# Patient Record
Sex: Female | Born: 1945
Health system: Southern US, Community
[De-identification: ages and names within clinical notes are randomized; demographics above are authoritative.]

## PROBLEM LIST (undated history)

## (undated) DIAGNOSIS — I10 Essential (primary) hypertension: Secondary | ICD-10-CM

## (undated) DIAGNOSIS — N809 Endometriosis, unspecified: Secondary | ICD-10-CM

## (undated) DIAGNOSIS — E785 Hyperlipidemia, unspecified: Secondary | ICD-10-CM

## (undated) HISTORY — DX: Hyperlipidemia, unspecified: E78.5

## (undated) HISTORY — PX: OTHER SURGICAL HISTORY: SHX169

---

## 1991-03-06 HISTORY — PX: HYSTERECTOMY ABDOMINAL WITH SALPINGECTOMY: SHX6725

## 1997-05-21 ENCOUNTER — Other Ambulatory Visit: Admission: RE | Admit: 1997-05-21 | Discharge: 1997-05-21 | Payer: Self-pay | Admitting: Obstetrics and Gynecology

## 1997-05-28 ENCOUNTER — Ambulatory Visit (HOSPITAL_COMMUNITY): Admission: RE | Admit: 1997-05-28 | Discharge: 1997-05-28 | Payer: Self-pay | Admitting: Obstetrics and Gynecology

## 2008-05-04 ENCOUNTER — Encounter: Admission: RE | Admit: 2008-05-04 | Discharge: 2008-05-04 | Payer: Self-pay | Admitting: Internal Medicine

## 2008-07-16 ENCOUNTER — Emergency Department (HOSPITAL_COMMUNITY): Admission: EM | Admit: 2008-07-16 | Discharge: 2008-07-16 | Payer: Self-pay | Admitting: Emergency Medicine

## 2008-07-20 ENCOUNTER — Inpatient Hospital Stay (HOSPITAL_COMMUNITY): Admission: RE | Admit: 2008-07-20 | Discharge: 2008-07-22 | Payer: Self-pay | Admitting: Orthopedic Surgery

## 2010-06-13 LAB — BASIC METABOLIC PANEL
BUN: 15 mg/dL (ref 6–23)
CO2: 29 mEq/L (ref 19–32)
Calcium: 9.2 mg/dL (ref 8.4–10.5)
Chloride: 106 mEq/L (ref 96–112)
Creatinine, Ser: 0.83 mg/dL (ref 0.4–1.2)
GFR calc Af Amer: >60 mL/min (ref >60)
GFR calc non Af Amer: >60 mL/min (ref >60)
Glucose, Bld: 105 mg/dL — ABNORMAL HIGH (ref 70–99)
Potassium: 3.9 mEq/L (ref 3.5–5.1)
Sodium: 141 mEq/L (ref 135–145)

## 2010-06-13 LAB — HEMOGLOBIN AND HEMATOCRIT, BLOOD
HCT: 32.8 % — ABNORMAL LOW (ref 36.0–46.0)
Hemoglobin: 10.9 g/dL — ABNORMAL LOW (ref 12.0–15.0)

## 2010-07-18 NOTE — Op Note (Signed)
NAMECOY, VANDOREN NO.:  [id number]   MEDICAL RECORD NO.:  [id number]          PATIENT TYPE:  INP   LOCATION:  0098                         FACILITY:  [facility]   PHYSICIAN:  Dionne Ano. Gramig, M.D.DATE OF BIRTH:  21-Jan-1946   DATE OF PROCEDURE:  DATE OF DISCHARGE:                               OPERATIVE REPORT   PREOPERATIVE DIAGNOSES:  Unstable irreducible left elbow dislocation  with multiligamentous stability.   POSTOPERATIVE DIAGNOSES:  Unstable irreducible left elbow dislocation  with multiligamentous stability.   PROCEDURES:  1. Open reduction left elbow dislocation.  2. Medial ulnar collateral ligament repair reconstruction with push-      lock technique.  3. Flexor pronator repair of the musculotendinous tissue left elbow      with suture anchor technique utilizing a Biomet looped suture      anchor and imbricated 2-0 FiberWire.  4. Cubital tunnel release left elbow.  5. Stress radiography.   SURGEON:  Dionne Ano. Amanda Pea, M.D.   ASSISTANT:  Karie Chimera, P.A.-C.   COMPLICATIONS:  None.   ANESTHESIA:  General.   TOURNIQUET TIME:  Less than 90 minutes.   INDICATIONS FOR PROCEDURE:  This patient is a pleasant female who  presented to the emergency room at [facility] on Friday night.  She was stabilized, referred and general orthopedics then saw her and  referred her to hand surgery for our care and expertise.  General  surgery felt uncomfortable with the multiligamentous injury and the  irreducible nature of her dislocation.  She has now had a dislocation  for over 5 days and has not gone onto quiescent state of affairs.  She  is currently in a dislocated state.  I counseled her in regards to this  and discussed with her the need for surgical reduction and repair ASAP.  She understands the risks and benefits of bleeding, infection,  anesthesia, damage to normal structures and failure of the surgery to  accomplish its intended goals of  relieving symptoms and restoring  function.  With this mind, she will proceed accordingly.  All questions  have been encouraged and answered preoperatively.   OPERATIVE PROCEDURE:  The patient was seen by myself and Anesthesia.  She was taken to the operative suite and underwent general anesthetic.  Prior to this, her arm was marked.  Consent verified and all questions  were encouraged and answered as well as a timeout being called.  Under  anesthesia, the arm was prepped.  I brought in live fluoro.  I saw a  fleck off of the ulnar collateral ligament medially indicating a highly  abnormal zone of injury.  She was stress tested.  I could get the elbow  back in, but she was certainly unstable.  At this time, I removed the x-  ray, secured the sterile field with Betadine scrub and paint and made a  posteromedial incision.  Dissection was carried down.  It was quite  apparent that she had completely blown out her flexor pronator mass and  her ulnar collateral ligament.  A fingertip could be placed directly in  the joint after  skin incision.  She had marked disarray of the tendon,  ligament and muscles.  We performed evaluation and removal of hematoma  and irrigation of the joint.  Her most impressively unstable region was  medially of course.  At this time, I identified the ulnar nerve.  I  released the ulnar nerve about the medial intermuscular septum, cubital  tunnel, two edges of the FCU and Osborne's ligament.  The arcade of  Struthers was also released without complicating feature.  I did not  transposed the nerve.  I kept the nerves out of harms way at all times.  The medial antebrachial cutaneous nerve branch was also identified and  protected.  Following this, I then placed a Krackow #2 FiberWire stitch  about the ulnar collateral ligament and then found the point of isometry  where it was avulsed for reattachment.  I then preplaced two suture  anchors from Biomet for flexor pronator  repair.  Following this, I then  irrigated copiously.  I reduced the elbow joint nicely, advanced the  push-lock with standard push-lock technology and this recreated the  anatomic distribution of the ulnar collateral ligament.  I then repaired  the flexor pronator mass.  The flexor pronator mass was repaired  separately and was a distinct and separate portion of the procedure as  was the cubital tunnel release given the nerve entrapment and kinking  secondary to the abnormal disarray of flexor pronator mass.  Following  this, I performed live stress radiography.  She was stable with full  range of motion with no evidence of instability as noted prior.  At this  time, I felt that external fixation and lateral ulnar collateral  ligament repair would not be necessary and thus irrigated and closed the  wound with Vicryl followed by a subcuticular suture.  Hemostasis was  obtained with bipolar cautery during the initial dissection and thus I  did not have to place a drain as she was quite stable and had a good  hemostatic field.  The patient had soft compartments, excellent radial  pulse and adequate refill at the conclusion of the operative  intervention.  The patient was placed in a sterile bandage with a  posterior plaster splint and stirrups with a Leavy Cella bridge placed as well.  The patient had x-rays in the splint taken as well which showed  excellent reduction.  I was pleased with this and the findings.  Final  stress radiography copies were made for documentation.  She was taken to  the recovery room.  She will be placed on Ancef 1 gram IV q.8 h., to  begin in the recovery room.  Pain management according to her needs of  course and our standard postop algorithm.   We will see her back in the office.  I want to keep her at 90 degrees  for 3 weeks.  I will go ahead and allow her flexion to full and  extension to 80-90 degrees after her first postop visit.  At 3 weeks  postop, I will  allow her to go down to 70 degrees and then we are going  to move her down by 10-15 degrees weekly thereafter.  We want to be very  careful and avoid any valgus stress to the arm in therapy, thus we are  going to keep her in supination.  I have discussed these issues with the  family.  All questions have been encouraged and answered.      Dionne Ano. Gramig,  M.D.  Electronically Signed     WMG/MEDQ  D:  07/20/2008  T:  07/20/2008  Job:  [id number]

## 2012-03-25 ENCOUNTER — Other Ambulatory Visit: Payer: Self-pay | Admitting: Internal Medicine

## 2012-03-25 DIAGNOSIS — Z1231 Encounter for screening mammogram for malignant neoplasm of breast: Secondary | ICD-10-CM

## 2012-04-24 ENCOUNTER — Ambulatory Visit
Admission: RE | Admit: 2012-04-24 | Discharge: 2012-04-24 | Disposition: A | Discharge status: Home / Self Care | Payer: Medicare HMO | Source: Ambulatory Visit | Attending: Internal Medicine | Admitting: Internal Medicine

## 2012-04-24 DIAGNOSIS — Z1231 Encounter for screening mammogram for malignant neoplasm of breast: Secondary | ICD-10-CM

## 2013-07-10 ENCOUNTER — Emergency Department (HOSPITAL_COMMUNITY)
Admission: EM | Admit: 2013-07-10 | Discharge: 2013-07-10 | Disposition: A | Discharge status: Home / Self Care | Payer: Medicare HMO | Attending: Emergency Medicine | Admitting: Emergency Medicine

## 2013-07-10 ENCOUNTER — Encounter (HOSPITAL_COMMUNITY): Payer: Self-pay | Admitting: Emergency Medicine

## 2013-07-10 ENCOUNTER — Emergency Department (HOSPITAL_COMMUNITY): Payer: Medicare HMO

## 2013-07-10 DIAGNOSIS — Y9389 Activity, other specified: Secondary | ICD-10-CM | POA: Insufficient documentation

## 2013-07-10 DIAGNOSIS — IMO0002 Reserved for concepts with insufficient information to code with codable children: Secondary | ICD-10-CM | POA: Insufficient documentation

## 2013-07-10 DIAGNOSIS — S9000XA Contusion of unspecified ankle, initial encounter: Secondary | ICD-10-CM | POA: Insufficient documentation

## 2013-07-10 DIAGNOSIS — S9001XA Contusion of right ankle, initial encounter: Secondary | ICD-10-CM

## 2013-07-10 DIAGNOSIS — Y929 Unspecified place or not applicable: Secondary | ICD-10-CM | POA: Insufficient documentation

## 2013-07-10 HISTORY — DX: Endometriosis, unspecified: N80.9

## 2013-07-10 HISTORY — DX: Essential (primary) hypertension: I10

## 2013-07-10 NOTE — ED Notes (Signed)
Pt states Monday hit her outter R ankle on the bed, states having pain at ankle bone, has put ice on it, states still having some some swelling and pain/throbbing.

## 2013-07-10 NOTE — Discharge Instructions (Signed)
Take up to 800mg  of ibuprofen three times a day for the next 3-4 days (take with food).  Ice 3 times a day for 15-20 minutes.  Elevate when possible to decrease swelling and pain.  Activity as tolerated.  Follow up with the orthopedist if your pain has not started to improve in 5-7 days, or you develop weakness of the injured joint.   You may return to the ER if your pain worsens or you have any other concerns.

## 2013-07-10 NOTE — ED Notes (Signed)
Patient transported to X-ray 

## 2013-07-10 NOTE — ED Provider Notes (Signed)
CSN: [id number]     Arrival date & time 07/10/13  1941 History   None    This chart was scribed for non-physician practitioner, Barton Dubois PA-C, working with Juanda Crumble B. Karle Starch, MD by Forrestine Him, ED Scribe. This patient was seen in room WTR5/WTR5 and the patient's care was started at 8:14 PM.   Chief Complaint  Patient presents with  . Ankle Pain   The history is provided by the patient. No language interpreter was used.    HPI Comments: Amy Serrano is a 68 y.o. female who presents to the Emergency Department complaining of constant, moderate R ankle pain x 4-5 days ago that is unchanged. She has also noted swelling to area. Pt states she hit her ankle against the bed Monday evening. This pain is exacerbated with ambulation and weight bearing. She has tried Epson salt soaks, elevation, and rest with mild improvement. However, she has not tried any OTC medications for pain. At this time she denies any fever, chills, weakness, paresthesia, or numbness. She has no pertinent past medical history. No other concerns this visit.  No past medical history on file. No past surgical history on file. No family history on file. History  Substance Use Topics  . Smoking status: Not on file  . Smokeless tobacco: Not on file  . Alcohol Use: Not on file   OB History   No data available     Review of Systems  Constitutional: Negative for fever and chills.  HENT: Negative for congestion.   Eyes: Negative for redness.  Respiratory: Negative for cough.   Musculoskeletal: Positive for arthralgias (R ankle).  Skin: Negative for rash.  Neurological: Negative for weakness and numbness.  Psychiatric/Behavioral: Negative for confusion.      Allergies  Review of patient's allergies indicates not on file.  Home Medications   Prior to Admission medications   Not on File   Triage Vitals: BP 134/63  Pulse 67  Temp(Src) 98 F (36.7 C) (Oral)  Resp 16  Ht 5\' 5"  (1.651 m)  Wt 211 lb  (95.709 kg)  BMI 35.11 kg/m2  SpO2 99%   Physical Exam  Nursing note and vitals reviewed. Constitutional: She is oriented to person, place, and time. She appears well-developed and well-nourished.  HENT:  Head: Normocephalic and atraumatic.  Eyes: EOM are normal.  Neck: Normal range of motion.  Cardiovascular: Normal rate.   Pulmonary/Chest: Effort normal.  Musculoskeletal: Normal range of motion.  Edema and tenderness lateral malleolus.  Pain w/ medial rotation of foot.  2+ DP pulse and distal sensation intact.    Neurological: She is alert and oriented to person, place, and time.  Skin: Skin is warm and dry.  Psychiatric: She has a normal mood and affect. Her behavior is normal.    ED Course  Procedures (including critical care time)  DIAGNOSTIC STUDIES: Oxygen Saturation is 99% on RA, Normal by my interpretation.    COORDINATION OF CARE: 8:20 PM- Will order DG Ankle Complete R. Discussed treatment plan with pt at bedside and pt agreed to plan.     Labs Review Labs Reviewed - No data to display  Imaging Review Dg Ankle Complete Right  07/10/2013   CLINICAL DATA:  Injury.  EXAM: RIGHT ANKLE - COMPLETE 3+ VIEW  COMPARISON:  None.  FINDINGS: There is minimal generalized soft tissue swelling over the ankle. Ankle mortise is within normal. There is no acute fracture or dislocation.  IMPRESSION: No acute fracture.   Electronically Signed  By: Marin Olp M.D.   On: 07/10/2013 20:10     EKG Interpretation None      MDM   Final diagnoses:  Contusion of right ankle   67yo F presents w/ R ankle injury.  No improvement w/ ice, elevation and rest. Edema and tenderness to lateral malleolus, no NV deficits on exam.  Xray neg for fx/dislocation. Will treat symptomatically contusion.  Ortho tech provided w/ cam walker and I recommended tylenol/motrin, continued ice/elevation and f/u with ortho.    I personally performed the services described in this documentation, which was  scribed in my presence. The recorded information has been reviewed and is accurate.    Arville Lime Schinlever, PA-C 07/11/13 0000

## 2013-07-11 NOTE — ED Provider Notes (Signed)
Medical screening examination/treatment/procedure(s) were performed by non-physician practitioner and as supervising physician I was immediately available for consultation/collaboration.   EKG Interpretation None        Amy B. Karle Starch, MD 07/11/13 1347

## 2014-05-13 ENCOUNTER — Encounter: Payer: Medicare PPO | Attending: Internal Medicine

## 2014-05-13 VITALS — Ht 65.0 in | Wt 221.3 lb

## 2014-05-13 DIAGNOSIS — E119 Type 2 diabetes mellitus without complications: Secondary | ICD-10-CM | POA: Diagnosis present

## 2014-05-13 DIAGNOSIS — Z713 Dietary counseling and surveillance: Secondary | ICD-10-CM | POA: Insufficient documentation

## 2014-05-18 NOTE — Progress Notes (Signed)
Patient was seen on 05/1014 for the first of a series of three diabetes self-management courses at the [facility].  Patient Education Plan per assessed needs and concerns is to attend four course education program for Diabetes Self Management Education.  The following learning objectives were met by the patient during this class:  Describe diabetes  State some common risk factors for diabetes  Defines the role of glucose and insulin  Identifies type of diabetes and pathophysiology  Describe the relationship between diabetes and cardiovascular risk  State the members of the Healthcare Team  States the rationale for glucose monitoring  State when to test glucose  State their individual Target Range  State the importance of logging glucose readings  Describe how to interpret glucose readings  Identifies A1C target  Explain the correlation between A1c and eAG values  State symptoms and treatment of high blood glucose  State symptoms and treatment of low blood glucose  Explain proper technique for glucose testing  Identifies proper sharps disposal  Handouts given during class include:  Living Well with Diabetes book  Carb Counting and Meal Planning book  Meal Plan Card  Carbohydrate guide  Meal planning worksheet  Low Sodium Flavoring Tips  The diabetes portion plate  A1c to eAG Conversion Chart  Diabetes Medications  Diabetes Recommended Care Schedule  Support Group  Diabetes Success Plan  Core Class Satisfaction Survey  Follow-Up Plan:  Attend core 2    

## 2014-05-20 DIAGNOSIS — E119 Type 2 diabetes mellitus without complications: Secondary | ICD-10-CM | POA: Diagnosis not present

## 2014-05-20 NOTE — Progress Notes (Signed)

## 2014-05-27 DIAGNOSIS — E119 Type 2 diabetes mellitus without complications: Secondary | ICD-10-CM

## 2014-05-27 NOTE — Progress Notes (Signed)
Patient was seen on 05/27/14 for the third of a series of three diabetes self-management courses at the [facility]. The following learning objectives were met by the patient during this class:  . State the amount of activity recommended for healthy living . Describe activities suitable for individual needs . Identify ways to regularly incorporate activity into daily life . Identify barriers to activity and ways to over come these barriers  Identify diabetes medications being personally used and their primary action for lowering glucose and possible side effects . Describe role of stress on blood glucose and develop strategies to address psychosocial issues . Identify diabetes complications and ways to prevent them  Explain how to manage diabetes during illness . Evaluate success in meeting personal goal . Establish 2-3 goals that they will plan to diligently work on until they return for the  60-monthfollow-up visit  Goals:   I will count my carb choices at most meals and snacks  I will continue to be active by walking, dancing and moving and not sitting for long periods of time  I will eat less unhealthy fats   I will look at patterns in my record book at least 3 days a month  Your patient has identified these potential barriers to change:  Stress  Your patient has identified their diabetes self-care support plan as  [facility] On-line Resources  Plan:  Attend Core 4 in 4 months

## 2014-08-01 ENCOUNTER — Emergency Department (HOSPITAL_COMMUNITY)
Admission: EM | Admit: 2014-08-01 | Discharge: 2014-08-01 | Disposition: A | Discharge status: Home / Self Care | Payer: Medicare PPO | Attending: Emergency Medicine | Admitting: Emergency Medicine

## 2014-08-01 ENCOUNTER — Emergency Department (HOSPITAL_COMMUNITY): Payer: Medicare PPO

## 2014-08-01 ENCOUNTER — Encounter (HOSPITAL_COMMUNITY): Payer: Self-pay

## 2014-08-01 DIAGNOSIS — R1010 Upper abdominal pain, unspecified: Secondary | ICD-10-CM | POA: Diagnosis not present

## 2014-08-01 DIAGNOSIS — Z8742 Personal history of other diseases of the female genital tract: Secondary | ICD-10-CM | POA: Diagnosis not present

## 2014-08-01 DIAGNOSIS — R112 Nausea with vomiting, unspecified: Secondary | ICD-10-CM | POA: Insufficient documentation

## 2014-08-01 DIAGNOSIS — R1013 Epigastric pain: Secondary | ICD-10-CM | POA: Diagnosis not present

## 2014-08-01 DIAGNOSIS — S80861A Insect bite (nonvenomous), right lower leg, initial encounter: Secondary | ICD-10-CM | POA: Insufficient documentation

## 2014-08-01 DIAGNOSIS — Z7982 Long term (current) use of aspirin: Secondary | ICD-10-CM | POA: Insufficient documentation

## 2014-08-01 DIAGNOSIS — Y998 Other external cause status: Secondary | ICD-10-CM | POA: Diagnosis not present

## 2014-08-01 DIAGNOSIS — Y9289 Other specified places as the place of occurrence of the external cause: Secondary | ICD-10-CM | POA: Diagnosis not present

## 2014-08-01 DIAGNOSIS — Z792 Long term (current) use of antibiotics: Secondary | ICD-10-CM | POA: Diagnosis not present

## 2014-08-01 DIAGNOSIS — W57XXXA Bitten or stung by nonvenomous insect and other nonvenomous arthropods, initial encounter: Secondary | ICD-10-CM | POA: Insufficient documentation

## 2014-08-01 DIAGNOSIS — Y9389 Activity, other specified: Secondary | ICD-10-CM | POA: Diagnosis not present

## 2014-08-01 DIAGNOSIS — Z8639 Personal history of other endocrine, nutritional and metabolic disease: Secondary | ICD-10-CM | POA: Diagnosis not present

## 2014-08-01 DIAGNOSIS — R197 Diarrhea, unspecified: Secondary | ICD-10-CM | POA: Insufficient documentation

## 2014-08-01 DIAGNOSIS — I10 Essential (primary) hypertension: Secondary | ICD-10-CM | POA: Insufficient documentation

## 2014-08-01 LAB — COMPREHENSIVE METABOLIC PANEL
ALT: 13 U/L — ABNORMAL LOW (ref 14–54)
AST: 19 U/L (ref 15–41)
Albumin: 3.8 g/dL (ref 3.5–5.0)
Alkaline Phosphatase: 72 U/L (ref 38–126)
Anion gap: 10 (ref 5–15)
BUN: 16 mg/dL (ref 6–20)
CO2: 27 mmol/L (ref 22–32)
Calcium: 9.1 mg/dL (ref 8.9–10.3)
Chloride: 103 mmol/L (ref 101–111)
Creatinine, Ser: 0.72 mg/dL (ref 0.44–1.00)
GFR calc Af Amer: >60 mL/min (ref >60)
GFR calc non Af Amer: >60 mL/min (ref >60)
Glucose, Bld: 137 mg/dL — ABNORMAL HIGH (ref 65–99)
Potassium: 3.6 mmol/L (ref 3.5–5.1)
Sodium: 140 mmol/L (ref 135–145)
Total Bilirubin: 0.5 mg/dL (ref 0.3–1.2)
Total Protein: 7.2 g/dL (ref 6.5–8.1)

## 2014-08-01 LAB — CBC WITH DIFFERENTIAL/PLATELET
Basophils Absolute: 0 10*3/uL (ref 0.0–0.1)
Basophils Relative: 0 % (ref 0–1)
Eosinophils Absolute: 0 10*3/uL (ref 0.0–0.7)
Eosinophils Relative: 0 % (ref 0–5)
HCT: 40.1 % (ref 36.0–46.0)
Hemoglobin: 13 g/dL (ref 12.0–15.0)
Lymphocytes Relative: 12 % (ref 12–46)
Lymphs Abs: 1.4 10*3/uL (ref 0.7–4.0)
MCH: 28.3 pg (ref 26.0–34.0)
MCHC: 32.4 g/dL (ref 30.0–36.0)
MCV: 87.4 fL (ref 78.0–100.0)
Monocytes Absolute: 0.5 10*3/uL (ref 0.1–1.0)
Monocytes Relative: 4 % (ref 3–12)
Neutro Abs: 10.1 10*3/uL — ABNORMAL HIGH (ref 1.7–7.7)
Neutrophils Relative %: 84 % — ABNORMAL HIGH (ref 43–77)
Platelets: 378 10*3/uL (ref 150–400)
RBC: 4.59 MIL/uL (ref 3.87–5.11)
RDW: 14.2 % (ref 11.5–15.5)
WBC: 12 10*3/uL — ABNORMAL HIGH (ref 4.0–10.5)

## 2014-08-01 LAB — LIPASE, BLOOD: Lipase: 15 U/L — ABNORMAL LOW (ref 22–51)

## 2014-08-01 MED ORDER — DICYCLOMINE HCL 10 MG PO CAPS
20.0000 mg | ORAL_CAPSULE | Freq: Once | ORAL | Status: AC
  Administered 2014-08-01: 20 mg via ORAL
  Filled 2014-08-01: qty 2

## 2014-08-01 MED ORDER — DICYCLOMINE HCL 10 MG PO CAPS: 20.0000 mg | ORAL_CAPSULE | Freq: Three times a day (TID) | ORAL | Status: DC | PRN

## 2014-08-01 MED ORDER — METOCLOPRAMIDE HCL 10 MG PO TABS: 10.0000 mg | ORAL_TABLET | Freq: Four times a day (QID) | ORAL | Status: DC | PRN

## 2014-08-01 NOTE — ED Notes (Signed)
Patient states she has been having abdominal cramping today with diarrhea and vomiting. Patient has a healing insect bite on her right calf that she received antibiotics for. Patient states she was told if she had other symptoms to go to the ED.

## 2014-08-01 NOTE — ED Provider Notes (Signed)
CSN: [id number]     Arrival date & time 08/01/14  1611 History   First MD Initiated Contact with Patient 08/01/14 1734     Chief Complaint  Patient presents with  . Abdominal Pain     (Consider location/radiation/quality/duration/timing/severity/associated sxs/prior Treatment) HPI Complains of crampy abdominal pain over upper abdomen which started 2 AM today comes in waves last one or 2 minutes at a time. Symptoms accompanied by one episode of vomiting and one episode of diarrhea, nonbloody nonbilious. She denies lightheadedness denies chest pain denies shortness of breath denies fever. No treatment prior to coming here. No other associated symptoms. Patient is currently being treated with Keflex for an insect bite on her right calf which occurred one week ago. She reports that pain at insect bite site is steadily improving and redness is improving as well. No other associated symptoms Past Medical History  Diagnosis Date  . Hypertension   . Endometriosis   . Hyperlipidemia    Past Surgical History  Procedure Laterality Date  . Arm surgery      left   hysterectomy Family History  Problem Relation Age of Onset  . Hypertension Other   . Stroke Other    History  Substance Use Topics  . Smoking status: Never Smoker   . Smokeless tobacco: Never Used  . Alcohol Use: No   OB History    No data available     Review of Systems  Constitutional: Negative.   HENT: Negative.   Respiratory: Negative.   Cardiovascular: Negative.   Gastrointestinal: Positive for nausea, vomiting and diarrhea.  Musculoskeletal: Negative.   Skin: Positive for wound.       Insect bite right Calf  Neurological: Negative.   Psychiatric/Behavioral: Negative.   All other systems reviewed and are negative.     Allergies  Review of patient's allergies indicates no known allergies.  Home Medications   Prior to Admission medications   Medication Sig Start Date End Date Taking? Authorizing Provider   amLODipine-benazepril (LOTREL) 10-20 MG per capsule Take 1 capsule by mouth daily.   Yes Historical Provider, MD  aspirin 81 MG tablet Take 81 mg by mouth daily.   Yes Historical Provider, MD  cephALEXin (KEFLEX) 500 MG capsule Take 1 capsule by mouth every 6 (six) hours. 07/26/14  Yes Historical Provider, MD  neomycin-bacitracin-polymyxin (NEOSPORIN) ointment Apply 1 application topically daily as needed (tick bite). apply to eye   Yes Historical Provider, MD   BP 157/83 mmHg  Pulse 91  Temp(Src) 98.1 F (36.7 C) (Oral)  Resp 18  SpO2 100% Physical Exam  Constitutional: She appears well-developed and well-nourished. No distress.  HENT:  Head: Normocephalic and atraumatic.  Eyes: Conjunctivae are normal. Pupils are equal, round, and reactive to light.  Neck: Neck supple. No tracheal deviation present. No thyromegaly present.  Cardiovascular: Normal rate and regular rhythm.   No murmur heard. Pulmonary/Chest: Effort normal and breath sounds normal.  Abdominal: Soft. Bowel sounds are normal. She exhibits no distension. There is tenderness.  Mildly tender at epigastrium  Musculoskeletal: Normal range of motion. She exhibits no edema or tenderness.  Neurological: She is alert. Coordination normal.  Skin: Skin is warm and dry. No rash noted.  Approximately 5 cm diameter hyperpigmented area right calf area and no tenderness or swelling  Psychiatric: She has a normal mood and affect.  Nursing note and vitals reviewed.   ED Course  Procedures (including critical care time) Labs Review Labs Reviewed  CBC WITH DIFFERENTIAL/PLATELET  COMPREHENSIVE METABOLIC PANEL  LIPASE, BLOOD  URINALYSIS, ROUTINE W REFLEX MICROSCOPIC (NOT AT [facility])    Imaging Review No results found.   EKG Interpretation None     8:45 PM pain is improved after treatment with Bentyl. She is able to drink water without nausea or vomiting. X-rays viewed by me Results for orders placed or performed during the  hospital encounter of 08/01/14  CBC with Differential  Result Value Ref Range   WBC 12.0 (H) 4.0 - 10.5 K/uL   RBC 4.59 3.87 - 5.11 MIL/uL   Hemoglobin 13.0 12.0 - 15.0 g/dL   HCT 40.1 36.0 - 46.0 %   MCV 87.4 78.0 - 100.0 fL   MCH 28.3 26.0 - 34.0 pg   MCHC 32.4 30.0 - 36.0 g/dL   RDW 14.2 11.5 - 15.5 %   Platelets 378 150 - 400 K/uL   Neutrophils Relative % 84 (H) 43 - 77 %   Neutro Abs 10.1 (H) 1.7 - 7.7 K/uL   Lymphocytes Relative 12 12 - 46 %   Lymphs Abs 1.4 0.7 - 4.0 K/uL   Monocytes Relative 4 3 - 12 %   Monocytes Absolute 0.5 0.1 - 1.0 K/uL   Eosinophils Relative 0 0 - 5 %   Eosinophils Absolute 0.0 0.0 - 0.7 K/uL   Basophils Relative 0 0 - 1 %   Basophils Absolute 0.0 0.0 - 0.1 K/uL  Comprehensive metabolic panel  Result Value Ref Range   Sodium 140 135 - 145 mmol/L   Potassium 3.6 3.5 - 5.1 mmol/L   Chloride 103 101 - 111 mmol/L   CO2 27 22 - 32 mmol/L   Glucose, Bld 137 (H) 65 - 99 mg/dL   BUN 16 6 - 20 mg/dL   Creatinine, Ser 0.72 0.44 - 1.00 mg/dL   Calcium 9.1 8.9 - 10.3 mg/dL   Total Protein 7.2 6.5 - 8.1 g/dL   Albumin 3.8 3.5 - 5.0 g/dL   AST 19 15 - 41 U/L   ALT 13 (L) 14 - 54 U/L   Alkaline Phosphatase 72 38 - 126 U/L   Total Bilirubin 0.5 0.3 - 1.2 mg/dL   GFR calc non Af Amer >60 >60 mL/min   GFR calc Af Amer >60 >60 mL/min   Anion gap 10 5 - 15  Lipase, blood  Result Value Ref Range   Lipase 15 (L) 22 - 51 U/L   Dg Abd Acute W/chest  08/01/2014   CLINICAL DATA:  Abdominal pain and cramping  EXAM: DG ABDOMEN ACUTE W/ 1V CHEST  COMPARISON:  None.  FINDINGS: Cardiac shadow is within normal limits. The lungs are well aerated bilaterally. No focal infiltrate is seen.  Scattered large and small bowel gas is noted. No obstructive changes are seen. No acute bony abnormality is noted.  IMPRESSION: No acute abnormality noted.   Electronically Signed   By: Inez Catalina M.D.   On: 08/01/2014 18:27    MDM   Crampy intermittent abdominal pain, vomiting  diarrhea suspect gastroenteritis. Patient is instructed to return if having significant pain 24 hours or see her primary care physician. Prescription Bentyl, Reglan .avoid dairy. She no longer has any pain whatsoever in her right calf at site of insect bite. Advised to Stop Keflex Diagnoses #1 abdominal pain #2 nausea, vomiting ,anddiarrhea #3hyperglycemia Final diagnoses:  None        Orlie Dakin, MD 08/01/14 2052

## 2014-08-01 NOTE — Discharge Instructions (Signed)
Abdominal Pain Your blood sugar today was mildly elevated at 137. Avoid milk or foods containing milk such as cheese or ice cream all having diarrhea. Take the medications prescribed as needed for abdominal cramps and for nausea. Stop the and antibiotic cephalexin (Keflex), as it may be contributing to diarrhea. Return if having significant pain in 24 hours or see Dr. Baird [facility] Many things can cause abdominal pain. Usually, abdominal pain is not caused by a disease and will improve without treatment. It can often be observed and treated at home. Your health care provider will do a physical exam and possibly order blood tests and X-rays to help determine the seriousness of your pain. However, in many cases, more time must pass before a clear cause of the pain can be found. Before that point, your health care provider may not know if you need more testing or further treatment. HOME CARE INSTRUCTIONS  Monitor your abdominal pain for any changes. The following actions may help to alleviate any discomfort you are experiencing:  Only take over-the-counter or prescription medicines as directed by your health care provider.  Do not take laxatives unless directed to do so by your health care provider.  Try a clear liquid diet (broth, tea, or water) as directed by your health care provider. Slowly move to a bland diet as tolerated. SEEK MEDICAL CARE IF:  You have unexplained abdominal pain.  You have abdominal pain associated with nausea or diarrhea.  You have pain when you urinate or have a bowel movement.  You experience abdominal pain that wakes you in the night.  You have abdominal pain that is worsened or improved by eating food.  You have abdominal pain that is worsened with eating fatty foods.  You have a fever. SEEK IMMEDIATE MEDICAL CARE IF:   Your pain does not go away within 2 hours.  You keep throwing up (vomiting).  Your pain is felt only in portions of the abdomen, such as the right  side or the left lower portion of the abdomen.  You pass bloody or black tarry stools. MAKE SURE YOU:  Understand these instructions.   Will watch your condition.   Will get help right away if you are not doing well or get worse.  Document Released: 11/29/2004 Document Revised: 02/24/2013 Document Reviewed: 10/29/2012 [facility] Patient Information 2015 [place], [place]. This information is not intended to replace advice given to you by your health care provider. Make sure you discuss any questions you have with your health care provider.

## 2014-08-07 ENCOUNTER — Encounter (HOSPITAL_COMMUNITY): Payer: Self-pay | Admitting: Emergency Medicine

## 2014-08-07 ENCOUNTER — Emergency Department (HOSPITAL_COMMUNITY)
Admission: EM | Admit: 2014-08-07 | Discharge: 2014-08-07 | Disposition: A | Discharge status: Home / Self Care | Payer: Medicare PPO | Attending: Emergency Medicine | Admitting: Emergency Medicine

## 2014-08-07 DIAGNOSIS — Z7982 Long term (current) use of aspirin: Secondary | ICD-10-CM | POA: Insufficient documentation

## 2014-08-07 DIAGNOSIS — T464X5A Adverse effect of angiotensin-converting-enzyme inhibitors, initial encounter: Secondary | ICD-10-CM

## 2014-08-07 DIAGNOSIS — T783XXA Angioneurotic edema, initial encounter: Secondary | ICD-10-CM | POA: Diagnosis not present

## 2014-08-07 DIAGNOSIS — Z8639 Personal history of other endocrine, nutritional and metabolic disease: Secondary | ICD-10-CM | POA: Insufficient documentation

## 2014-08-07 DIAGNOSIS — Z8742 Personal history of other diseases of the female genital tract: Secondary | ICD-10-CM | POA: Insufficient documentation

## 2014-08-07 DIAGNOSIS — I10 Essential (primary) hypertension: Secondary | ICD-10-CM | POA: Diagnosis not present

## 2014-08-07 DIAGNOSIS — Z79899 Other long term (current) drug therapy: Secondary | ICD-10-CM | POA: Insufficient documentation

## 2014-08-07 DIAGNOSIS — T445X5A Adverse effect of predominantly beta-adrenoreceptor agonists, initial encounter: Secondary | ICD-10-CM | POA: Diagnosis not present

## 2014-08-07 DIAGNOSIS — R220 Localized swelling, mass and lump, head: Secondary | ICD-10-CM | POA: Diagnosis present

## 2014-08-07 MED ORDER — FAMOTIDINE 20 MG PO TABS: 20.0000 mg | ORAL_TABLET | Freq: Two times a day (BID) | ORAL | Status: DC

## 2014-08-07 MED ORDER — METHYLPREDNISOLONE SODIUM SUCC 125 MG IJ SOLR
125.0000 mg | Freq: Once | INTRAMUSCULAR | Status: AC
  Administered 2014-08-07: 125 mg via INTRAVENOUS
  Filled 2014-08-07: qty 2

## 2014-08-07 MED ORDER — DIPHENHYDRAMINE HCL 25 MG PO TABS: 25.0000 mg | ORAL_TABLET | Freq: Four times a day (QID) | ORAL | Status: DC | PRN

## 2014-08-07 MED ORDER — DIPHENHYDRAMINE HCL 50 MG/ML IJ SOLN
25.0000 mg | Freq: Once | INTRAMUSCULAR | Status: AC
  Administered 2014-08-07: 25 mg via INTRAVENOUS
  Filled 2014-08-07: qty 1

## 2014-08-07 MED ORDER — AMLODIPINE BESYLATE 10 MG PO TABS: 10.0000 mg | ORAL_TABLET | Freq: Every day | ORAL | Status: DC

## 2014-08-07 MED ORDER — PREDNISONE 20 MG PO TABS: 40.0000 mg | ORAL_TABLET | Freq: Every day | ORAL | Status: DC

## 2014-08-07 MED ORDER — FAMOTIDINE IN NACL 20-0.9 MG/50ML-% IV SOLN
20.0000 mg | INTRAVENOUS | Status: AC
  Administered 2014-08-07: 20 mg via INTRAVENOUS
  Filled 2014-08-07: qty 50

## 2014-08-07 NOTE — ED Notes (Signed)
Pt reports she began to get L side facial swelling last night. MD at bedside. Pt speaking in full sentences with no difficulty.

## 2014-08-07 NOTE — Discharge Instructions (Signed)
YOU MUST NEVER TAKE THEM BLOOD PRESURE MEDICATION CALLED BENAZIPRIL OR ANY OTHER  "ACE INHIBITOR" EVER AGAIN - THIS MAY CAUSE A LIFE THREATENING ALLERGIC REACTION  CALL YOUR DOCTOR TO DISCUSS IN THE MORNING  Angioedema Angioedema is sudden puffiness (swelling), often of the skin. It can happen:  On your face or privates (genitals).  In your belly (abdomen) or other body parts. It usually happens quickly and gets better in 1 or 2 days. It often starts at night and is found when you wake up. You may get red, itchy patches of skin (hives). Attacks can be dangerous if your breathing passages get puffy. The condition may happen only once, or it can come back at random times. It may happen for several years before it goes away for good. HOME CARE  Only take medicines as told by your doctor.  Always carry your emergency allergy medicines with you.  Wear a medical bracelet as told by your doctor.  Avoid things that you know will cause attacks (triggers). GET HELP IF:  You have another attack.  Your attacks happen more often or get worse.  The condition was passed to you by your parents and you want to have children. GET HELP RIGHT AWAY IF:   Your mouth, tongue, or lips are very puffy.  You have trouble breathing.  You have trouble swallowing.  You pass out (faint). MAKE SURE YOU:   Understand these instructions.  Will watch your condition.  Will get help right away if you are not doing well or get worse. Document Released: 02/07/2009 Document Revised: 12/10/2012 Document Reviewed: 10/13/2012 [facility] Patient Information 2015 [place], [place]. This information is not intended to replace advice given to you by your health care provider. Make sure you discuss any questions you have with your health care provider.

## 2014-08-07 NOTE — ED Provider Notes (Signed)
CSN: [id number]     Arrival date & time 08/07/14  0802 History   First MD Initiated Contact with Patient 08/07/14 [phone]     Chief Complaint  Patient presents with  . Facial Swelling     (Consider location/radiation/quality/duration/timing/severity/associated sxs/prior Treatment) HPI Comments: The patient is a 69 year old female, she has a history of hypertension and a recent history of abdominal discomfort for which she was started on dicyclomine. She presents to the hospital today with swelling of the left side of the face and the lower lip which started last night, it was mild, this morning she noticed increased swelling of the lower lip and the left cheek. There has been no difficulty breathing swallowing or speaking, she has no associated chest pain shortness breath fevers chills nausea vomiting abdominal pain back pain weakness or numbness. She has never had this reaction before, she has been on a combination blood pressure medication with amlodipine and an ACE inhibitor for approximately 2 years. There has been no history of angioedema reactions in the past. Nothing seems to make this better or worse.  The history is provided by the patient and the spouse.    Past Medical History  Diagnosis Date  . Hypertension   . Endometriosis   . Hyperlipidemia    Past Surgical History  Procedure Laterality Date  . Arm surgery      left   Family History  Problem Relation Age of Onset  . Hypertension Other   . Stroke Other    History  Substance Use Topics  . Smoking status: Never Smoker   . Smokeless tobacco: Never Used  . Alcohol Use: No   OB History    No data available     Review of Systems  All other systems reviewed and are negative.     Allergies  Benazepril  Home Medications   Prior to Admission medications   Medication Sig Start Date End Date Taking? Authorizing Provider  aspirin 81 MG tablet Take 81 mg by mouth daily.   Yes Historical Provider, MD  dicyclomine  (BENTYL) 10 MG capsule Take 2 capsules (20 mg total) by mouth 3 (three) times daily between meals as needed for spasms. 08/01/14  Yes Orlie Dakin, MD  metoCLOPramide (REGLAN) 10 MG tablet Take 1 tablet (10 mg total) by mouth every 6 (six) hours as needed for nausea (nausea/headache). 08/01/14  Yes Orlie Dakin, MD  neomycin-bacitracin-polymyxin (NEOSPORIN) ointment Apply 1 application topically daily as needed (tick bite).    Yes Historical Provider, MD  amLODipine (NORVASC) 10 MG tablet Take 1 tablet (10 mg total) by mouth daily. 08/07/14   Noemi Chapel, MD  diphenhydrAMINE (BENADRYL) 25 MG tablet Take 1 tablet (25 mg total) by mouth every 6 (six) hours as needed for itching (Rash). 08/07/14   Noemi Chapel, MD  famotidine (PEPCID) 20 MG tablet Take 1 tablet (20 mg total) by mouth 2 (two) times daily. 08/07/14   Noemi Chapel, MD  predniSONE (DELTASONE) 20 MG tablet Take 2 tablets (40 mg total) by mouth daily. 08/07/14   Noemi Chapel, MD   BP 145/86 mmHg  Pulse 64  Resp 16  SpO2 100% Physical Exam  Constitutional: She appears well-developed and well-nourished. No distress.  HENT:  Head: Normocephalic and atraumatic.  Mouth/Throat: Oropharynx is clear and moist. No oropharyngeal exudate.  Angioedema of the lower lip, left upper lip, left lower cheek, oropharynx is well visualized, there is no swelling of the tongue, dentition is normal, no trismus or torticollis  Eyes: Conjunctivae and EOM are normal. Pupils are equal, round, and reactive to light. Right eye exhibits no discharge. Left eye exhibits no discharge. No scleral icterus.  Neck: Normal range of motion. Neck supple. No JVD present. No thyromegaly present.  Supple neck, no lymphadenopathy  Cardiovascular: Normal rate, regular rhythm, normal heart sounds and intact distal pulses.  Exam reveals no gallop and no friction rub.   No murmur heard. Pulmonary/Chest: Effort normal and breath sounds normal. No respiratory distress. She has no wheezes.  She has no rales.  Abdominal: Soft. Bowel sounds are normal. She exhibits no distension and no mass. There is no tenderness.  Musculoskeletal: Normal range of motion. She exhibits no edema or tenderness.  Lymphadenopathy:    She has no cervical adenopathy.  Neurological: She is alert. Coordination normal.  Skin: Skin is warm and dry. No rash noted. No erythema.  Psychiatric: She has a normal mood and affect. Her behavior is normal.  Nursing note and vitals reviewed.   ED Course  Procedures (including critical care time) Labs Review Labs Reviewed - No data to display  Imaging Review No results found.    MDM   Final diagnoses:  ACE inhibitor-aggravated angioedema, initial encounter    There is no rash to the skin, vital signs are within normal limits given mild hypertension, the patient is likely having an angioedema event related to the ACE inhibitor for which she takes for blood pressure. I've given her extensive counseling as to how she cannot use this medication anymore, she will have an observation the emergency department to make sure this is not worsening, medications given as below, the patient has expressed her understanding and agreement with the plan.  Over 3 hours of observation the patient had no worsening of her swelling, she still is protecting her airway and has no difficulty speaking swallowing or breathing. Vital signs remained in a stable range, she was extensively counseled regarding the use of Ace inhibitors, she has been told to stop this medication immediately, and new prescription for Norvasc has been given, she will follow-up with her doctor Monday morning.  Meds given in ED:  Medications  famotidine (PEPCID) IVPB 20 mg premix (0 mg Intravenous Stopped 08/07/14 1012)  methylPREDNISolone sodium succinate (SOLU-MEDROL) 125 mg/2 mL injection 125 mg (125 mg Intravenous Given 08/07/14 0855)  diphenhydrAMINE (BENADRYL) injection 25 mg (25 mg Intravenous Given 08/07/14  0855)    New Prescriptions   AMLODIPINE (NORVASC) 10 MG TABLET    Take 1 tablet (10 mg total) by mouth daily.   DIPHENHYDRAMINE (BENADRYL) 25 MG TABLET    Take 1 tablet (25 mg total) by mouth every 6 (six) hours as needed for itching (Rash).   FAMOTIDINE (PEPCID) 20 MG TABLET    Take 1 tablet (20 mg total) by mouth 2 (two) times daily.   PREDNISONE (DELTASONE) 20 MG TABLET    Take 2 tablets (40 mg total) by mouth daily.        Noemi Chapel, MD 08/07/14 1101

## 2014-09-20 DIAGNOSIS — I129 Hypertensive chronic kidney disease with stage 1 through stage 4 chronic kidney disease, or unspecified chronic kidney disease: Secondary | ICD-10-CM | POA: Diagnosis not present

## 2014-09-20 DIAGNOSIS — R7309 Other abnormal glucose: Secondary | ICD-10-CM | POA: Diagnosis not present

## 2014-09-20 DIAGNOSIS — E559 Vitamin D deficiency, unspecified: Secondary | ICD-10-CM | POA: Diagnosis not present

## 2014-09-28 DIAGNOSIS — R300 Dysuria: Secondary | ICD-10-CM | POA: Diagnosis not present

## 2014-09-28 DIAGNOSIS — N08 Glomerular disorders in diseases classified elsewhere: Secondary | ICD-10-CM | POA: Diagnosis not present

## 2014-09-28 DIAGNOSIS — E1122 Type 2 diabetes mellitus with diabetic chronic kidney disease: Secondary | ICD-10-CM | POA: Diagnosis not present

## 2014-09-28 DIAGNOSIS — I129 Hypertensive chronic kidney disease with stage 1 through stage 4 chronic kidney disease, or unspecified chronic kidney disease: Secondary | ICD-10-CM | POA: Diagnosis not present

## 2014-09-28 DIAGNOSIS — Z1239 Encounter for other screening for malignant neoplasm of breast: Secondary | ICD-10-CM | POA: Diagnosis not present

## 2014-09-28 DIAGNOSIS — Z0000 Encounter for general adult medical examination without abnormal findings: Secondary | ICD-10-CM | POA: Diagnosis not present

## 2014-09-28 DIAGNOSIS — N182 Chronic kidney disease, stage 2 (mild): Secondary | ICD-10-CM | POA: Diagnosis not present

## 2014-09-28 DIAGNOSIS — Z1211 Encounter for screening for malignant neoplasm of colon: Secondary | ICD-10-CM | POA: Diagnosis not present

## 2014-09-29 ENCOUNTER — Encounter: Payer: Medicare PPO | Attending: Internal Medicine

## 2014-09-29 DIAGNOSIS — Z713 Dietary counseling and surveillance: Secondary | ICD-10-CM | POA: Insufficient documentation

## 2014-09-29 DIAGNOSIS — E119 Type 2 diabetes mellitus without complications: Secondary | ICD-10-CM | POA: Insufficient documentation

## 2014-09-29 NOTE — Progress Notes (Signed)
Appt start time: 0900 end time:  1000.  Patient was seen on 09/29/2014 for a review of the series of three diabetes self-management courses at the [facility]. The following learning objectives were met by the patient during this class:  . Reviewed blood glucose monitoring and interpretation including the recommended target ranges and Hgb A1c.  . Reviewed on carb counting, importance of regularly scheduled meals/snacks, and meal planning.  . Reviewed the effects of physical activity on glucose levels and long-term glucose control.  Recommended goal of 150 minutes of physical activity/week. . Reviewed patient medications and discussed role of medication on blood glucose and possible side effects. . Discussed strategies to manage stress, psychosocial issues, and other obstacles to diabetes management. . Encouraged moderate weight reduction to improve glucose levels.   . Reviewed short-term complications: hyper- and hypo-glycemia.  Discussed causes, symptoms, and treatment options. . Reviewed prevention, detection, and treatment of long-term complications.  Discussed the role of prolonged elevated glucose levels on body systems.  Goals:  Follow Diabetes Meal Plan as instructed  Eat 3 meals and 2 snacks, every 3-5 hrs  Limit carbohydrate intake to 45 grams carbohydrate/meal Limit carbohydrate intake to 15 grams carbohydrate/snack Add lean protein foods to meals/snacks  Monitor glucose levels as instructed by your doctor  Aim for goal of 15-30 mins of physical activity daily as tolerated  Bring food record and glucose log to your next nutrition visit

## 2014-10-28 DIAGNOSIS — R140 Abdominal distension (gaseous): Secondary | ICD-10-CM | POA: Diagnosis not present

## 2014-10-28 DIAGNOSIS — Z1211 Encounter for screening for malignant neoplasm of colon: Secondary | ICD-10-CM | POA: Diagnosis not present

## 2014-12-08 DIAGNOSIS — D128 Benign neoplasm of rectum: Secondary | ICD-10-CM | POA: Diagnosis not present

## 2014-12-08 DIAGNOSIS — K5730 Diverticulosis of large intestine without perforation or abscess without bleeding: Secondary | ICD-10-CM | POA: Diagnosis not present

## 2014-12-08 DIAGNOSIS — Z1211 Encounter for screening for malignant neoplasm of colon: Secondary | ICD-10-CM | POA: Diagnosis not present

## 2014-12-08 DIAGNOSIS — K621 Rectal polyp: Secondary | ICD-10-CM | POA: Diagnosis not present

## 2014-12-08 DIAGNOSIS — K635 Polyp of colon: Secondary | ICD-10-CM | POA: Diagnosis not present

## 2015-08-05 ENCOUNTER — Encounter (HOSPITAL_COMMUNITY): Payer: Self-pay | Admitting: *Deleted

## 2015-08-05 ENCOUNTER — Emergency Department (HOSPITAL_COMMUNITY): Payer: Medicare Other

## 2015-08-05 ENCOUNTER — Emergency Department (HOSPITAL_COMMUNITY)
Admission: EM | Admit: 2015-08-05 | Discharge: 2015-08-06 | Disposition: A | Discharge status: Home / Self Care | Payer: Medicare Other | Attending: Emergency Medicine | Admitting: Emergency Medicine

## 2015-08-05 DIAGNOSIS — Z7982 Long term (current) use of aspirin: Secondary | ICD-10-CM | POA: Diagnosis not present

## 2015-08-05 DIAGNOSIS — K5730 Diverticulosis of large intestine without perforation or abscess without bleeding: Secondary | ICD-10-CM | POA: Diagnosis not present

## 2015-08-05 DIAGNOSIS — E785 Hyperlipidemia, unspecified: Secondary | ICD-10-CM | POA: Diagnosis not present

## 2015-08-05 DIAGNOSIS — I10 Essential (primary) hypertension: Secondary | ICD-10-CM | POA: Diagnosis not present

## 2015-08-05 DIAGNOSIS — R1030 Lower abdominal pain, unspecified: Secondary | ICD-10-CM | POA: Diagnosis present

## 2015-08-05 DIAGNOSIS — K5732 Diverticulitis of large intestine without perforation or abscess without bleeding: Secondary | ICD-10-CM

## 2015-08-05 LAB — URINALYSIS, ROUTINE W REFLEX MICROSCOPIC
Bilirubin Urine: NEGATIVE
Glucose, UA: NEGATIVE mg/dL
Hgb urine dipstick: NEGATIVE
Ketones, ur: NEGATIVE mg/dL
Leukocytes, UA: NEGATIVE
Nitrite: NEGATIVE
Protein, ur: NEGATIVE mg/dL
Specific Gravity, Urine: 1.011 (ref 1.005–1.030)
pH: 7.5 (ref 5.0–8.0)

## 2015-08-05 LAB — CBC
HCT: 39.6 % (ref 36.0–46.0)
Hemoglobin: 13 g/dL (ref 12.0–15.0)
MCH: 28.4 pg (ref 26.0–34.0)
MCHC: 32.8 g/dL (ref 30.0–36.0)
MCV: 86.5 fL (ref 78.0–100.0)
Platelets: 351 10*3/uL (ref 150–400)
RBC: 4.58 MIL/uL (ref 3.87–5.11)
RDW: 14.6 % (ref 11.5–15.5)
WBC: 9.2 10*3/uL (ref 4.0–10.5)

## 2015-08-05 LAB — COMPREHENSIVE METABOLIC PANEL
ALT: 20 U/L (ref 14–54)
AST: 25 U/L (ref 15–41)
Albumin: 4.5 g/dL (ref 3.5–5.0)
Alkaline Phosphatase: 74 U/L (ref 38–126)
Anion gap: 9 (ref 5–15)
BUN: 16 mg/dL (ref 6–20)
CO2: 25 mmol/L (ref 22–32)
Calcium: 9.4 mg/dL (ref 8.9–10.3)
Chloride: 104 mmol/L (ref 101–111)
Creatinine, Ser: 0.89 mg/dL (ref 0.44–1.00)
GFR calc Af Amer: >60 mL/min (ref >60)
GFR calc non Af Amer: >60 mL/min (ref >60)
Glucose, Bld: 138 mg/dL — ABNORMAL HIGH (ref 65–99)
Potassium: 3.8 mmol/L (ref 3.5–5.1)
Sodium: 138 mmol/L (ref 135–145)
Total Bilirubin: 0.6 mg/dL (ref 0.3–1.2)
Total Protein: 8.6 g/dL — ABNORMAL HIGH (ref 6.5–8.1)

## 2015-08-05 LAB — LIPASE, BLOOD: Lipase: 19 U/L (ref 11–51)

## 2015-08-05 MED ORDER — IOPAMIDOL (ISOVUE-300) INJECTION 61%
100.0000 mL | Freq: Once | INTRAVENOUS | Status: AC | PRN
  Administered 2015-08-05: 100 mL via INTRAVENOUS

## 2015-08-05 MED ORDER — CIPROFLOXACIN HCL 500 MG PO TABS
500.0000 mg | ORAL_TABLET | Freq: Once | ORAL | Status: AC
  Administered 2015-08-06: 500 mg via ORAL
  Filled 2015-08-05: qty 1

## 2015-08-05 MED ORDER — METRONIDAZOLE 500 MG PO TABS
500.0000 mg | ORAL_TABLET | Freq: Once | ORAL | Status: AC
  Administered 2015-08-06: 500 mg via ORAL
  Filled 2015-08-05: qty 1

## 2015-08-05 MED ORDER — CIPROFLOXACIN HCL 500 MG PO TABS: 500.0000 mg | ORAL_TABLET | Freq: Two times a day (BID) | ORAL | Status: DC

## 2015-08-05 MED ORDER — METRONIDAZOLE 500 MG PO TABS: 500.0000 mg | ORAL_TABLET | Freq: Two times a day (BID) | ORAL | Status: DC

## 2015-08-05 MED ORDER — HYDROCODONE-ACETAMINOPHEN 5-325 MG PO TABS
2.0000 | ORAL_TABLET | Freq: Once | ORAL | Status: AC
  Administered 2015-08-06: 2 via ORAL
  Filled 2015-08-05: qty 2

## 2015-08-05 MED ORDER — ONDANSETRON 4 MG PO TBDP
4.0000 mg | ORAL_TABLET | Freq: Once | ORAL | Status: AC
Start: 2015-08-05 — End: 2015-08-06
  Administered 2015-08-06: 4 mg via ORAL
  Filled 2015-08-05: qty 1

## 2015-08-05 MED ORDER — ONDANSETRON 4 MG PO TBDP: 4.0000 mg | ORAL_TABLET | Freq: Three times a day (TID) | ORAL | Status: DC | PRN

## 2015-08-05 MED ORDER — AMLODIPINE BESYLATE 10 MG PO TABS
10.0000 mg | ORAL_TABLET | Freq: Once | ORAL | Status: AC
  Administered 2015-08-06: 10 mg via ORAL
  Filled 2015-08-05: qty 1

## 2015-08-05 MED ORDER — HYDROCODONE-ACETAMINOPHEN 5-325 MG PO TABS: 1.0000 | ORAL_TABLET | ORAL | Status: DC | PRN

## 2015-08-05 NOTE — ED Notes (Signed)
Lower abdominal cramping (worse on the left than the right), associated with nausea for several days.

## 2015-08-05 NOTE — ED Provider Notes (Signed)
CSN: [id number]     Arrival date & time 08/05/15  1951 History   First MD Initiated Contact with Patient 08/05/15 2126     Chief Complaint  Patient presents with  . Abdominal Pain     (Consider location/radiation/quality/duration/timing/severity/associated sxs/prior Treatment) HPI Comments: 70yo F w/ HTN and HLD who p/w abdominal pain. The patient states that for the past few days, she has had intermittent, crampy lower abdominal pain that is bilateral but is worse on the left side compared to the right. She has had associated nausea but no true vomiting. No diarrhea or blood in her stool. No fevers or urinary symptoms. No chest pain or shortness of breath. No sick contacts or recent travel. No recent changes to her medications. She has not tried any medications for her pain. She did try a heating pad. Currently, her pain is severe.  Patient is a 70 y.o. female presenting with abdominal pain. The history is provided by the patient.  Abdominal Pain   Past Medical History  Diagnosis Date  . Hypertension   . Endometriosis   . Hyperlipidemia    Past Surgical History  Procedure Laterality Date  . Arm surgery      left   Family History  Problem Relation Age of Onset  . Hypertension Other   . Stroke Other    Social History  Substance Use Topics  . Smoking status: Never Smoker   . Smokeless tobacco: Never Used  . Alcohol Use: No   OB History    No data available     Review of Systems  Gastrointestinal: Positive for abdominal pain.    10 Systems reviewed and are negative for acute change except as noted in the HPI.   Allergies  Benazepril and Ace inhibitors  Home Medications   Prior to Admission medications   Medication Sig Start Date End Date Taking? Authorizing Provider  amLODipine (NORVASC) 10 MG tablet Take 1 tablet (10 mg total) by mouth daily. 08/07/14  Yes Noemi Chapel, MD  aspirin 81 MG tablet Take 81 mg by mouth at bedtime.    Yes Historical Provider, MD   pravastatin (PRAVACHOL) 20 MG tablet Take 20 mg by mouth daily.   Yes Historical Provider, MD  ciprofloxacin (CIPRO) 500 MG tablet Take 1 tablet (500 mg total) by mouth 2 (two) times daily. 08/05/15   Sharlett Iles, MD  HYDROcodone-acetaminophen (NORCO/VICODIN) 5-325 MG tablet Take 1-2 tablets by mouth every 4 (four) hours as needed for severe pain. 08/05/15   Sharlett Iles, MD  metroNIDAZOLE (FLAGYL) 500 MG tablet Take 1 tablet (500 mg total) by mouth 2 (two) times daily. 08/05/15   Sharlett Iles, MD  neomycin-bacitracin-polymyxin (NEOSPORIN) ointment Apply 1 application topically daily as needed (tick bite).     Historical Provider, MD  ondansetron (ZOFRAN ODT) 4 MG disintegrating tablet Take 1 tablet (4 mg total) by mouth every 8 (eight) hours as needed for nausea or vomiting. 08/05/15   Wenda Overland Little, MD   BP 191/84 mmHg  Pulse 78  Temp(Src) 99 F (37.2 C)  Resp 18  Ht 5\' 5"  (1.651 m)  Wt 216 lb (97.977 kg)  BMI 35.94 kg/m2  SpO2 99% Physical Exam  Constitutional: She is oriented to person, place, and time. She appears well-developed and well-nourished. No distress.  Uncomfortable, rubbing abdomen  HENT:  Head: Normocephalic and atraumatic.  Moist mucous membranes  Eyes: Conjunctivae are normal. Pupils are equal, round, and reactive to light.  Neck: Neck supple.  Cardiovascular: Normal rate, regular rhythm and normal heart sounds.   No murmur heard. Pulmonary/Chest: Effort normal and breath sounds normal.  Abdominal: Soft. Bowel sounds are normal. She exhibits no distension. There is tenderness (RLQ, LLQ). There is no rebound and no guarding.  Musculoskeletal: She exhibits no edema.  Neurological: She is alert and oriented to person, place, and time.  Fluent speech, normal gait  Skin: Skin is warm and dry.  Psychiatric: She has a normal mood and affect. Judgment normal.  Nursing note and vitals reviewed.   ED Course  Procedures (including critical care  time) Labs Review Labs Reviewed  COMPREHENSIVE METABOLIC PANEL - Abnormal; Notable for the following:    Glucose, Bld 138 (*)    Total Protein 8.6 (*)    All other components within normal limits  LIPASE, BLOOD  CBC  URINALYSIS, ROUTINE W REFLEX MICROSCOPIC (NOT AT [facility])    Imaging Review Dg Chest 2 View  08/05/2015  CLINICAL DATA:  Left-sided abdominal pain cramping and nausea for several days. EXAM: CHEST  2 VIEW COMPARISON:  08/01/2014 FINDINGS: The heart size is normal. Mild tortuosity of thoracic aorta is stable. Both lungs are clear. Mild thoracic spine degenerative changes again noted. IMPRESSION: No active cardiopulmonary disease. Electronically Signed   By: Earle Gell M.D.   On: 08/05/2015 22:29   Ct Abdomen Pelvis W Contrast  08/05/2015  CLINICAL DATA:  Right lower and left lower quadrant abdominal cramping for a few days. EXAM: CT ABDOMEN AND PELVIS WITH CONTRAST TECHNIQUE: Multidetector CT imaging of the abdomen and pelvis was performed using the standard protocol following bolus administration of intravenous contrast. CONTRAST:  133mL ISOVUE-300 IOPAMIDOL (ISOVUE-300) INJECTION 61% COMPARISON:  None. FINDINGS: Lower chest and abdominal wall:  No contributory findings. Hepatobiliary: Steatosis without focal finding.Cholelithiasis without signs of biliary obstruction or inflammation. Pancreas: Unremarkable. Spleen: Unremarkable. Adrenals/Urinary Tract: Negative adrenals. No hydronephrosis or stone. Bilateral renal cortical cysts. Unremarkable bladder. Reproductive:Hysterectomy.  Negative for adnexal mass. Stomach/Bowel: Colonic diverticulosis, greatest distally where there is focal inflammation around a sigmoid diverticulum. No abscess or pneumoperitoneum. No bowel obstruction or appendicitis. Vascular/Lymphatic: No acute vascular abnormality. Atherosclerosis. No mass or adenopathy. Peritoneal: Trace pelvic fluid considered reactive. Musculoskeletal: No acute abnormalities. Advanced disc  and facet degeneration with dextroscoliosis. IMPRESSION: 1. Uncomplicated sigmoid diverticulitis. 2. Hepatic steatosis and cholelithiasis. Electronically Signed   By: Monte Fantasia M.D.   On: 08/05/2015 23:25   I have personally reviewed and evaluated these lab results as part of my medical decision-making.   EKG Interpretation None     Medications  ciprofloxacin (CIPRO) tablet 500 mg (not administered)  HYDROcodone-acetaminophen (NORCO/VICODIN) 5-325 MG per tablet 2 tablet (not administered)  metroNIDAZOLE (FLAGYL) tablet 500 mg (not administered)  ondansetron (ZOFRAN-ODT) disintegrating tablet 4 mg (not administered)  amLODipine (NORVASC) tablet 10 mg (not administered)  iopamidol (ISOVUE-300) 61 % injection 100 mL (100 mLs Intravenous Contrast Given 08/05/15 2304)    MDM   Final diagnoses:  Sigmoid diverticulitis   Patient presents with several days of lower abdominal cramping associated with nausea. On exam, she was uncomfortable but nontoxic and in no acute distress. Vital signs notable for hypertension, she was afebrile. She had right lower quadrant and left lower quadrant tenderness on exam without distention or rebound. Obtained above lab work which showed no evidence of UTI or dehydration. Normal LFTs and lipase. Given her tenderness, obtained CT of abdomen and pelvis which showed uncomplicated sigmoid diverticulitis. On reexamination, the patient was comfortable. I discussed CT findings  and gave the patient a dose of Cipro and Flagyl here as well as Zofran and Vicodin. Discussed home treatments with the same medications and extensively reviewed return precautions. She's had no vomiting here and I anticipate discharge after taking these medications and by mouth challenge.  Sharlett Iles, MD 08/05/15 [phone]

## 2015-08-05 NOTE — Discharge Instructions (Signed)
Diverticulitis °Diverticulitis is inflammation or infection of small pouches in your colon that form when you have a condition called diverticulosis. The pouches in your colon are called diverticula. Your colon, or large intestine, is where water is absorbed and stool is formed. °Complications of diverticulitis can include: °· Bleeding. °· Severe infection. °· Severe pain. °· Perforation of your colon. °· Obstruction of your colon. °CAUSES  °Diverticulitis is caused by bacteria. °Diverticulitis happens when stool becomes trapped in diverticula. This allows bacteria to grow in the diverticula, which can lead to inflammation and infection. °RISK FACTORS °People with diverticulosis are at risk for diverticulitis. Eating a diet that does not include enough fiber from fruits and vegetables may make diverticulitis more likely to develop. °SYMPTOMS  °Symptoms of diverticulitis may include: °· Abdominal pain and tenderness. The pain is normally located on the left side of the abdomen, but may occur in other areas. °· Fever and chills. °· Bloating. °· Cramping. °· Nausea. °· Vomiting. °· Constipation. °· Diarrhea. °· Blood in your stool. °DIAGNOSIS  °Your health care provider will ask you about your medical history and do a physical exam. You may need to have tests done because many medical conditions can cause the same symptoms as diverticulitis. Tests may include: °· Blood tests. °· Urine tests. °· Imaging tests of the abdomen, including X-rays and CT scans. °When your condition is under control, your health care provider may recommend that you have a colonoscopy. A colonoscopy can show how severe your diverticula are and whether something else is causing your symptoms. °TREATMENT  °Most cases of diverticulitis are mild and can be treated at home. Treatment may include: °· Taking over-the-counter pain medicines. °· Following a clear liquid diet. °· Taking antibiotic medicines by mouth for 7-10 days. °More severe cases may  be treated at a hospital. Treatment may include: °· Not eating or drinking. °· Taking prescription pain medicine. °· Receiving antibiotic medicines through an IV tube. °· Receiving fluids and nutrition through an IV tube. °· Surgery. °HOME CARE INSTRUCTIONS  °· Follow your health care provider's instructions carefully. °· Follow a full liquid diet or other diet as directed by your health care provider. After your symptoms improve, your health care provider may tell you to change your diet. He or she may recommend you eat a high-fiber diet. Fruits and vegetables are good sources of fiber. Fiber makes it easier to pass stool. °· Take fiber supplements or probiotics as directed by your health care provider. °· Only take medicines as directed by your health care provider. °· Keep all your follow-up appointments. °SEEK MEDICAL CARE IF:  °· Your pain does not improve. °· You have a hard time eating food. °· Your bowel movements do not return to normal. °SEEK IMMEDIATE MEDICAL CARE IF:  °· Your pain becomes worse. °· Your symptoms do not get better. °· Your symptoms suddenly get worse. °· You have a fever. °· You have repeated vomiting. °· You have bloody or black, tarry stools. °MAKE SURE YOU:  °· Understand these instructions. °· Will watch your condition. °· Will get help right away if you are not doing well or get worse. °  °This information is not intended to replace advice given to you by your health care provider. Make sure you discuss any questions you have with your health care provider. °  °Document Released: 11/29/2004 Document Revised: 02/24/2013 Document Reviewed: 01/14/2013 °Elsevier Interactive Patient Education ©2016 [employer] ° °

## 2015-08-06 NOTE — ED Provider Notes (Signed)
  Physical Exam  BP 143/81 mmHg  Pulse 78  Temp(Src) 99 F (37.2 C)  Resp 18  Ht 5\' 5"  (1.651 m)  Wt 216 lb (97.977 kg)  BMI 35.94 kg/m2  SpO2 99%  Physical Exam  ED Course  Procedures  MDM Care assumed from Dr. Rex Kras at 11:40 pm. Patient here with ab pain. Hx of diverticulitis. CT showed mild diverticulitis with no abscess. WBC nl. Sign out pending PO challenge. Tolerated PO cipro, flagyl, PO fluids. Felt better. BP was 190 on arrival and went down to 140 on discharge. DC paperwork done by Dr. Rex Kras. Stable for dc.   Wandra Arthurs, MD 08/06/15 [phone]

## 2015-10-05 ENCOUNTER — Other Ambulatory Visit: Payer: Self-pay | Admitting: Internal Medicine

## 2015-10-05 DIAGNOSIS — Z1231 Encounter for screening mammogram for malignant neoplasm of breast: Secondary | ICD-10-CM

## 2015-10-19 ENCOUNTER — Ambulatory Visit
Admission: RE | Admit: 2015-10-19 | Discharge: 2015-10-19 | Disposition: A | Discharge status: Home / Self Care | Payer: Medicare Other | Source: Ambulatory Visit | Attending: Internal Medicine | Admitting: Internal Medicine

## 2015-10-19 DIAGNOSIS — Z1231 Encounter for screening mammogram for malignant neoplasm of breast: Secondary | ICD-10-CM

## 2017-12-16 ENCOUNTER — Telehealth: Payer: Self-pay | Admitting: Internal Medicine

## 2017-12-16 NOTE — Telephone Encounter (Signed)
I left a message asking the pt to come at 8:30 instead of 9:15 on 01/01/18.

## 2018-01-01 ENCOUNTER — Ambulatory Visit (INDEPENDENT_AMBULATORY_CARE_PROVIDER_SITE_OTHER): Payer: Medicare Other | Admitting: Internal Medicine

## 2018-01-01 ENCOUNTER — Encounter: Payer: Self-pay | Admitting: Internal Medicine

## 2018-01-01 ENCOUNTER — Ambulatory Visit: Payer: Medicare Other

## 2018-01-01 VITALS — BP 142/82 | HR 68 | Temp 98.3°F | Ht 65.0 in | Wt 211.6 lb

## 2018-01-01 DIAGNOSIS — E661 Drug-induced obesity: Secondary | ICD-10-CM

## 2018-01-01 DIAGNOSIS — R7309 Other abnormal glucose: Secondary | ICD-10-CM | POA: Diagnosis not present

## 2018-01-01 DIAGNOSIS — N182 Chronic kidney disease, stage 2 (mild): Secondary | ICD-10-CM | POA: Diagnosis not present

## 2018-01-01 DIAGNOSIS — I129 Hypertensive chronic kidney disease with stage 1 through stage 4 chronic kidney disease, or unspecified chronic kidney disease: Secondary | ICD-10-CM | POA: Diagnosis not present

## 2018-01-01 DIAGNOSIS — Z0000 Encounter for general adult medical examination without abnormal findings: Secondary | ICD-10-CM

## 2018-01-01 DIAGNOSIS — Z6835 Body mass index (BMI) 35.0-35.9, adult: Secondary | ICD-10-CM

## 2018-01-01 LAB — POCT URINALYSIS DIPSTICK
Bilirubin, UA: NEGATIVE
Blood, UA: NEGATIVE
Glucose, UA: NEGATIVE
Ketones, UA: NEGATIVE
Nitrite, UA: NEGATIVE
Protein, UA: NEGATIVE
Spec Grav, UA: 1.015 (ref 1.010–1.025)
Urobilinogen, UA: 0.2 E.U./dL
pH, UA: 6.5 (ref 5.0–8.0)

## 2018-01-01 NOTE — Progress Notes (Signed)
Subjective:     Patient ID: Amy Serrano , female    DOB: 20-Jan-1946 , 72 y.o.   MRN: [id number]   Chief Complaint  Patient presents with  . Hypertension    HPI  Hypertension  This is a chronic problem. The current episode started more than 1 year ago. The problem is controlled. Risk factors for coronary artery disease include obesity and sedentary lifestyle. Hypertensive end-organ damage includes kidney disease.   She admits that she has been under a lot of stress. Her husband was recently diagnosed with leukemia. She transports him to [facility] at least three days weekly.   Past Medical History:  Diagnosis Date  . Endometriosis   . Hyperlipidemia   . Hypertension       Current Outpatient Medications:  .  amLODipine (NORVASC) 10 MG tablet, Take 1 tablet (10 mg total) by mouth daily., Disp: 30 tablet, Rfl: 1 .  aspirin 81 MG tablet, Take 81 mg by mouth at bedtime. , Disp: , Rfl:  .  ciprofloxacin (CIPRO) 500 MG tablet, Take 1 tablet (500 mg total) by mouth 2 (two) times daily. (Patient not taking: Reported on 01/01/2018), Disp: 14 tablet, Rfl: 0 .  hydrochlorothiazide (HYDRODIURIL) 12.5 MG tablet, Take 12.5 mg by mouth 3 (three) times a week., Disp: , Rfl: 2 .  HYDROcodone-acetaminophen (NORCO/VICODIN) 5-325 MG tablet, Take 1-2 tablets by mouth every 4 (four) hours as needed for severe pain. (Patient not taking: Reported on 01/01/2018), Disp: 12 tablet, Rfl: 0 .  metroNIDAZOLE (FLAGYL) 500 MG tablet, Take 1 tablet (500 mg total) by mouth 2 (two) times daily. (Patient not taking: Reported on 01/01/2018), Disp: 14 tablet, Rfl: 0 .  neomycin-bacitracin-polymyxin (NEOSPORIN) ointment, Apply 1 application topically daily as needed (tick bite). , Disp: , Rfl:  .  ondansetron (ZOFRAN ODT) 4 MG disintegrating tablet, Take 1 tablet (4 mg total) by mouth every 8 (eight) hours as needed for nausea or vomiting. (Patient not taking: Reported on 01/01/2018), Disp: 6 tablet, Rfl: 0 .  pravastatin  (PRAVACHOL) 20 MG tablet, Take 20 mg by mouth daily., Disp: , Rfl:    Allergies  Allergen Reactions  . Benazepril Swelling    Swelling of the face   . Ace Inhibitors     Angioedema     Review of Systems  Constitutional: Negative.   HENT: Negative.   Respiratory: Negative.   Cardiovascular: Negative.   Gastrointestinal: Negative.   Genitourinary: Negative.   Skin: Negative.   Neurological: Negative.   Hematological: Negative.   Psychiatric/Behavioral: Negative.      Today's Vitals   01/01/18 0906  BP: (!) 142/82  Pulse: 68  Temp: 98.3 F (36.8 C)  TempSrc: Oral  Weight: 211 lb 9.6 oz (96 kg)  Height: _0  (1.651 m)   Body mass index is 35.21 kg/m.   Objective:  Physical Exam  Constitutional: She is oriented to person, place, and time. She appears well-developed and well-nourished.  HENT:  Head: Normocephalic and atraumatic.  Eyes: EOM are normal.  Cardiovascular: Normal rate, regular rhythm and normal heart sounds.  Pulmonary/Chest: Effort normal and breath sounds normal.  Neurological: She is alert and oriented to person, place, and time.  Skin: Skin is warm and dry.  Psychiatric: She has a normal mood and affect.  Nursing note and vitals reviewed.       Assessment And Plan:     1. Hypertensive nephropathy  Uncontrolled. She will continue with current meds for now. She is encouraged to  avoid adding salt to her foods. THE ANNUAL WELLNESS VISIT WAS PERFORMED INCLUDING DISCUSSION OF ADVANCED DIRECTIVES, AND ASSESSMENT OF COGNITIVE FUNCTION.  - CMP14+EGFR - Lipid Profile - Hemoglobin A1c - CBC no Diff  2. Chronic renal disease, stage II  Chronic. She is encouraged to stay well hydrated.   3. Other abnormal glucose  HER A1C HAS BEEN ELEVATED IN THE PAST. I WILL CHECK AN A1C, BMET TODAY. SHE WAS ENCOURAGED TO AVOID SUGARY BEVERAGES AND PROCESSED FOODS INCLUDNG BREADS, RICE AND PASTA.  - Hemoglobin A1c  4. Class 2 drug-induced obesity with serious  comorbidity and body mass index (BMI) of 35.0 to 35.9 in adult  She is encouraged to strive for BMI less than 30 to decrease cardiac risk. She is advised to exercise no less than five days weekly for at least 30 minutes.   Maximino Greenland, MD

## 2018-01-01 NOTE — Patient Instructions (Signed)
Amy Serrano , Thank you for taking time to come for your Medicare Wellness Visit. I appreciate your ongoing commitment to your health goals. Please review the following plan we discussed and let me know if I can assist you in the future.   Screening recommendations/referrals: Colonoscopy: up to date Mammogram: need referral Bone Density: up to date  Recommended yearly ophthalmology/optometry visit for glaucoma screening and checkup Recommended yearly dental visit for hygiene and checkup  Vaccinations: Influenza vaccine: 11/2017 Pneumococcal vaccine: 03/2012 Tdap vaccine: 03/2013 Shingles vaccine: due    Advanced directives: Please bring a copy of your POA (Power of [place]) and/or Living Will to your next appointment.    Conditions/risks identified: Obesity: Getting at least 7000 steps daily. Goal is to lose weight and get under 200 pounds  Next appointment: 01/01/2018 at [place] 65 Years and Older, Female Preventive care refers to lifestyle choices and visits with your health care provider that can promote health and wellness. What does preventive care include?  A yearly physical exam. This is also called an annual well check.  Dental exams once or twice a year.  Routine eye exams. Ask your health care provider how often you should have your eyes checked.  Personal lifestyle choices, including:  Daily care of your teeth and gums.  Regular physical activity.  Eating a healthy diet.  Avoiding tobacco and drug use.  Limiting alcohol use.  Practicing safe sex.  Taking low-dose aspirin every day.  Taking vitamin and mineral supplements as recommended by your health care provider. What happens during an annual well check? The services and screenings done by your health care provider during your annual well check will depend on your age, overall health, lifestyle risk factors, and family history of disease. Counseling  Your health care provider may ask  you questions about your:  Alcohol use.  Tobacco use.  Drug use.  Emotional well-being.  Home and relationship well-being.  Sexual activity.  Eating habits.  History of falls.  Memory and ability to understand (cognition).  Work and work Statistician.  Reproductive health. Screening  You may have the following tests or measurements:  Height, weight, and BMI.  Blood pressure.  Lipid and cholesterol levels. These may be checked every 5 years, or more frequently if you are over 68 years old.  Skin check.  Lung cancer screening. You may have this screening every year starting at age 73 if you have a 30-pack-year history of smoking and currently smoke or have quit within the past 15 years.  Fecal occult blood test (FOBT) of the stool. You may have this test every year starting at age 73.  Flexible sigmoidoscopy or colonoscopy. You may have a sigmoidoscopy every 5 years or a colonoscopy every 10 years starting at age 73.  Hepatitis C blood test.  Hepatitis B blood test.  Sexually transmitted disease (STD) testing.  Diabetes screening. This is done by checking your blood sugar (glucose) after you have not eaten for a while (fasting). You may have this done every 1-3 years.  Bone density scan. This is done to screen for osteoporosis. You may have this done starting at age 73.  Mammogram. This may be done every 1-2 years. Talk to your health care provider about how often you should have regular mammograms. Talk with your health care provider about your test results, treatment options, and if necessary, the need for more tests. Vaccines  Your health care provider may recommend certain vaccines, such as:  Influenza vaccine. This is recommended every year.  Tetanus, diphtheria, and acellular pertussis (Tdap, Td) vaccine. You may need a Td booster every 10 years.  Zoster vaccine. You may need this after age 73.  Pneumococcal 13-valent conjugate (PCV13) vaccine. One dose  is recommended after age 73.  Pneumococcal polysaccharide (PPSV23) vaccine. One dose is recommended after age 73. Talk to your health care provider about which screenings and vaccines you need and how often you need them. This information is not intended to replace advice given to you by your health care provider. Make sure you discuss any questions you have with your health care provider. Document Released: 03/18/2015 Document Revised: 11/09/2015 Document Reviewed: 12/21/2014 Elsevier Interactive Patient Education  2017 [place] [employer] Prevention in the Home Falls can cause injuries. They can happen to people of all ages. There are many things you can do to make your home safe and to help prevent falls. What can I do on the outside of my home?  Regularly fix the edges of walkways and driveways and fix any cracks.  Remove anything that might make you trip as you walk through a door, such as a raised step or threshold.  Trim any bushes or trees on the path to your home.  Use bright outdoor lighting.  Clear any walking paths of anything that might make someone trip, such as rocks or tools.  Regularly check to see if handrails are loose or broken. Make sure that both sides of any steps have handrails.  Any raised decks and porches should have guardrails on the edges.  Have any leaves, snow, or ice cleared regularly.  Use sand or salt on walking paths during winter.  Clean up any spills in your garage right away. This includes oil or grease spills. What can I do in the bathroom?  Use night lights.  Install grab bars by the toilet and in the tub and shower. Do not use towel bars as grab bars.  Use non-skid mats or decals in the tub or shower.  If you need to sit down in the shower, use a plastic, non-slip stool.  Keep the floor dry. Clean up any water that spills on the floor as soon as it happens.  Remove soap buildup in the tub or shower regularly.  Attach bath mats  securely with double-sided non-slip rug tape.  Do not have throw rugs and other things on the floor that can make you trip. What can I do in the bedroom?  Use night lights.  Make sure that you have a light by your bed that is easy to reach.  Do not use any sheets or blankets that are too big for your bed. They should not hang down onto the floor.  Have a firm chair that has side arms. You can use this for support while you get dressed.  Do not have throw rugs and other things on the floor that can make you trip. What can I do in the kitchen?  Clean up any spills right away.  Avoid walking on wet floors.  Keep items that you use a lot in easy-to-reach places.  If you need to reach something above you, use a strong step stool that has a grab bar.  Keep electrical cords out of the way.  Do not use floor polish or wax that makes floors slippery. If you must use wax, use non-skid floor wax.  Do not have throw rugs and other things on the floor that  can make you trip. What can I do with my stairs?  Do not leave any items on the stairs.  Make sure that there are handrails on both sides of the stairs and use them. Fix handrails that are broken or loose. Make sure that handrails are as long as the stairways.  Check any carpeting to make sure that it is firmly attached to the stairs. Fix any carpet that is loose or worn.  Avoid having throw rugs at the top or bottom of the stairs. If you do have throw rugs, attach them to the floor with carpet tape.  Make sure that you have a light switch at the top of the stairs and the bottom of the stairs. If you do not have them, ask someone to add them for you. What else can I do to help prevent falls?  Wear shoes that:  Do not have high heels.  Have rubber bottoms.  Are comfortable and fit you well.  Are closed at the toe. Do not wear sandals.  If you use a stepladder:  Make sure that it is fully opened. Do not climb a closed  stepladder.  Make sure that both sides of the stepladder are locked into place.  Ask someone to hold it for you, if possible.  Clearly mark and make sure that you can see:  Any grab bars or handrails.  First and last steps.  Where the edge of each step is.  Use tools that help you move around (mobility aids) if they are needed. These include:  Canes.  Walkers.  Scooters.  Crutches.  Turn on the lights when you go into a dark area. Replace any light bulbs as soon as they burn out.  Set up your furniture so you have a clear path. Avoid moving your furniture around.  If any of your floors are uneven, fix them.  If there are any pets around you, be aware of where they are.  Review your medicines with your doctor. Some medicines can make you feel dizzy. This can increase your chance of falling. Ask your doctor what other things that you can do to help prevent falls. This information is not intended to replace advice given to you by your health care provider. Make sure you discuss any questions you have with your health care provider. Document Released: 12/16/2008 Document Revised: 07/28/2015 Document Reviewed: 03/26/2014 Elsevier Interactive Patient Education  2017 [employer].

## 2018-01-01 NOTE — Progress Notes (Signed)
Subjective:   Amy Serrano is a 72 y.o. female who presents for Medicare Annual (Subsequent) preventive examination.  Review of Systems:  n/a Cardiac Risk Factors include: advanced age (>99men, >56 women);hypertension     Objective:     Vitals: BP (!) 142/82 (BP Location: Left Arm)   Pulse 68   Temp 98.3 F (36.8 C) (Oral)   Ht 5\' 5"  (1.651 m)   Wt 211 lb 9.6 oz (96 kg)   BMI 35.21 kg/m   Body mass index is 35.21 kg/m.  Advanced Directives 01/01/2018 08/05/2015 08/07/2014 08/01/2014 05/18/2014  Does Patient Have a Medical Advance Directive? Yes No No No No  Type of Advance Directive [place]  Does patient want to make changes to medical advance directive? No - Patient declined - - - -  Copy of [place] in Chart? No - copy requested - - - -  Would patient like information on creating a medical advance directive? - No - patient declined information No - patient declined information No - patient declined information No - patient declined information    Tobacco Social History   Tobacco Use  Smoking Status Never Smoker  Smokeless Tobacco Never Used     Counseling given: Not Answered   Clinical Intake:  Pre-visit preparation completed: Yes  Pain : No/denies pain Pain Score: 0-No pain     Nutritional Status: BMI > 30  Obese Nutritional Risks: Nausea/ vomitting/ diarrhea Diabetes: No  How often do you need to have someone help you when you read instructions, pamphlets, or other written materials from your doctor or pharmacy?: 1 - Never What is the last grade level you completed in school?: 3 years of college  Interpreter Needed?: No  Information entered by :: NAllen LPN  Past Medical History:  Diagnosis Date  . Endometriosis   . Hyperlipidemia   . Hypertension    Past Surgical History:  Procedure Laterality Date  . arm surgery     left   Family History  Problem Relation Age of Onset  . Hypertension  Other   . Stroke Other    Social History   Socioeconomic History  . Marital status: Married    Spouse name: Not on file  . Number of children: Not on file  . Years of education: Not on file  . Highest education level: Not on file  Occupational History  . Occupation: retired  Scientific laboratory technician  . Financial resource strain: Not hard at all  . Food insecurity:    Worry: Never true    Inability: Never true  . Transportation needs:    Medical: No    Non-medical: No  Tobacco Use  . Smoking status: Never Smoker  . Smokeless tobacco: Never Used  Substance and Sexual Activity  . Alcohol use: No  . Drug use: No  . Sexual activity: Not Currently  Lifestyle  . Physical activity:    Days per week: 7 days    Minutes per session: 20 min  . Stress: To some extent  Relationships  . Social connections:    Talks on phone: Not on file    Gets together: Not on file    Attends religious service: Not on file    Active member of club or organization: Not on file    Attends meetings of clubs or organizations: Not on file    Relationship status: Not on file  Other Topics Concern  . Not on  file  Social History Narrative  . Not on file    Outpatient Encounter Medications as of 01/01/2018  Medication Sig  . amLODipine (NORVASC) 10 MG tablet Take 1 tablet (10 mg total) by mouth daily.  Marland Kitchen aspirin 81 MG tablet Take 81 mg by mouth at bedtime.   . ciprofloxacin (CIPRO) 500 MG tablet Take 1 tablet (500 mg total) by mouth 2 (two) times daily. (Patient not taking: Reported on 01/01/2018)  . hydrochlorothiazide (HYDRODIURIL) 12.5 MG tablet Take 12.5 mg by mouth 3 (three) times a week.  Marland Kitchen HYDROcodone-acetaminophen (NORCO/VICODIN) 5-325 MG tablet Take 1-2 tablets by mouth every 4 (four) hours as needed for severe pain. (Patient not taking: Reported on 01/01/2018)  . metroNIDAZOLE (FLAGYL) 500 MG tablet Take 1 tablet (500 mg total) by mouth 2 (two) times daily. (Patient not taking: Reported on 01/01/2018)    . neomycin-bacitracin-polymyxin (NEOSPORIN) ointment Apply 1 application topically daily as needed (tick bite).   . ondansetron (ZOFRAN ODT) 4 MG disintegrating tablet Take 1 tablet (4 mg total) by mouth every 8 (eight) hours as needed for nausea or vomiting. (Patient not taking: Reported on 01/01/2018)  . pravastatin (PRAVACHOL) 20 MG tablet Take 20 mg by mouth daily.  . [DISCONTINUED] amLODipine-benazepril (LOTREL) 10-20 MG per capsule Take 1 capsule by mouth daily.  . [DISCONTINUED] dicyclomine (BENTYL) 10 MG capsule Take 2 capsules (20 mg total) by mouth 3 (three) times daily between meals as needed for spasms. (Patient not taking: Reported on 08/05/2015)  . [DISCONTINUED] diphenhydrAMINE (BENADRYL) 25 MG tablet Take 1 tablet (25 mg total) by mouth every 6 (six) hours as needed for itching (Rash). (Patient not taking: Reported on 08/05/2015)  . [DISCONTINUED] famotidine (PEPCID) 20 MG tablet Take 1 tablet (20 mg total) by mouth 2 (two) times daily. (Patient not taking: Reported on 08/05/2015)  . [DISCONTINUED] metoCLOPramide (REGLAN) 10 MG tablet Take 1 tablet (10 mg total) by mouth every 6 (six) hours as needed for nausea (nausea/headache). (Patient not taking: Reported on 08/05/2015)   No facility-administered encounter medications on file as of 01/01/2018.     Activities of Daily Living In your present state of health, do you have any difficulty performing the following activities: 01/01/2018  Hearing? N  Vision? N  Difficulty concentrating or making decisions? N  Walking or climbing stairs? N  Dressing or bathing? N  Doing errands, shopping? N  Preparing Food and eating ? N  Using the Toilet? N  In the past six months, have you accidently leaked urine? Y  Comment rare  Do you have problems with loss of bowel control? N  Managing your Medications? N  Managing your Finances? N  Housekeeping or managing your Housekeeping? N  Some recent data might be hidden    Patient Care  Team: Glendale Chard, MD as PCP - General (Internal Medicine) Marylynn Pearson, MD as Consulting Physician (Ophthalmology)    Assessment:   This is a routine wellness examination for [facility].  Exercise Activities and Dietary recommendations Current Exercise Habits: Home exercise routine, Type of exercise: walking, Time (Minutes): 15, Frequency (Times/Week): 7, Weekly Exercise (Minutes/Week): 105, Intensity: Moderate, Exercise limited by: None identified  Goals    . Weight (lb) < 200 lb (90.7 kg) (pt-stated)       Fall Risk Fall Risk  01/01/2018 05/18/2014  Falls in the past year? No No  Risk for fall due to : Medication side effect -   Is the patient's home free of loose throw rugs in walkways, pet  beds, electrical cords, etc?   yes      Grab bars in the bathroom? no      Handrails on the stairs?   n/a      Adequate lighting?   yes  Timed Get Up and Go performed: n/a  Depression Screen PHQ 2/9 Scores 01/01/2018 05/18/2014  PHQ - 2 Score 0 0     Cognitive Function     6CIT Screen 01/01/2018  What Year? 0 points  What month? 0 points  What time? 0 points  Count back from 20 0 points  Months in reverse 2 points  Repeat phrase 0 points  Total Score 2    Immunization History  Administered Date(s) Administered  . Influenza-Unspecified 12/03/2013    Qualifies for Shingles Vaccine? yes  Screening Tests Health Maintenance  Topic Date Due  . HEMOGLOBIN A1C  [birth year]  . Hepatitis C Screening  05-Aug-1945  . FOOT EXAM  08/10/1954  . OPHTHALMOLOGY EXAM  08/10/1954  . URINE MICROALBUMIN  08/10/1954  . TETANUS/TDAP  08/10/1963  . COLONOSCOPY  08/10/1994  . DEXA SCAN  08/09/2009  . PNA vac Low Risk Adult (1 of 2 - PCV13) 08/09/2009  . INFLUENZA VACCINE  10/03/2017  . MAMMOGRAM  10/18/2017    Cancer Screenings: Lung: Low Dose CT Chest recommended if Age 2-80 years, 30 pack-year currently smoking OR have quit w/in 15years. Patient does not qualify. Breast:  Up to date  on Mammogram? Yes   Up to date of Bone Density/Dexa? Yes Colorectal: up to date  Additional Screenings: : Hepatitis C Screening: n/a     Plan:    Patient needs referral for mammogram. She had eye exam in 08/2017.  I have personally reviewed and noted the following in the patient's chart:   . Medical and social history . Use of alcohol, tobacco or illicit drugs  . Current medications and supplements . Functional ability and status . Nutritional status . Physical activity . Advanced directives . List of other physicians . Hospitalizations, surgeries, and ER visits in previous 12 months . Vitals . Screenings to include cognitive, depression, and falls . Referrals and appointments  In addition, I have reviewed and discussed with patient certain preventive protocols, quality metrics, and best practice recommendations. A written personalized care plan for preventive services as well as general preventive health recommendations were provided to patient.     Kellie Simmering, LPN  [phone]

## 2018-01-01 NOTE — Patient Instructions (Signed)

## 2018-01-02 LAB — CMP14+EGFR
ALT: 14 IU/L (ref 0–32)
AST: 17 IU/L (ref 0–40)
Albumin/Globulin Ratio: 1.6 (ref 1.2–2.2)
Albumin: 4.2 g/dL (ref 3.5–4.8)
Alkaline Phosphatase: 75 IU/L (ref 39–117)
BUN/Creatinine Ratio: 11 — ABNORMAL LOW (ref 12–28)
BUN: 11 mg/dL (ref 8–27)
Bilirubin Total: 0.3 mg/dL (ref 0.0–1.2)
CO2: 25 mmol/L (ref 20–29)
Calcium: 9.2 mg/dL (ref 8.7–10.3)
Chloride: 105 mmol/L (ref 96–106)
Creatinine, Ser: 0.98 mg/dL (ref 0.57–1.00)
GFR calc Af Amer: 66 mL/min/{1.73_m2} (ref >59)
GFR calc non Af Amer: 57 mL/min/{1.73_m2} — ABNORMAL LOW (ref >59)
Globulin, Total: 2.7 g/dL (ref 1.5–4.5)
Glucose: 111 mg/dL — ABNORMAL HIGH (ref 65–99)
Potassium: 4.5 mmol/L (ref 3.5–5.2)
Sodium: 143 mmol/L (ref 134–144)
Total Protein: 6.9 g/dL (ref 6.0–8.5)

## 2018-01-02 LAB — LIPID PANEL
Chol/HDL Ratio: 5 ratio — ABNORMAL HIGH (ref 0.0–4.4)
Cholesterol, Total: 240 mg/dL — ABNORMAL HIGH (ref 100–199)
HDL: 48 mg/dL (ref >39)
LDL Calculated: 162 mg/dL — ABNORMAL HIGH (ref 0–99)
Triglycerides: 149 mg/dL (ref 0–149)
VLDL Cholesterol Cal: 30 mg/dL (ref 5–40)

## 2018-01-02 LAB — HEMOGLOBIN A1C
Est. average glucose Bld gHb Est-mCnc: 126 mg/dL
Hgb A1c MFr Bld: 6 % — ABNORMAL HIGH (ref 4.8–5.6)

## 2018-01-02 LAB — CBC
Hematocrit: 36.9 % (ref 34.0–46.6)
Hemoglobin: 12 g/dL (ref 11.1–15.9)
MCH: 28.9 pg (ref 26.6–33.0)
MCHC: 32.5 g/dL (ref 31.5–35.7)
MCV: 89 fL (ref 79–97)
Platelets: 338 10*3/uL (ref 150–450)
RBC: 4.15 x10E6/uL (ref 3.77–5.28)
RDW: 13.9 % (ref 12.3–15.4)
WBC: 6.8 10*3/uL (ref 3.4–10.8)

## 2018-01-02 NOTE — Progress Notes (Signed)
Here are your lab results:  Your kidney function is stable. Your liver function is normal. Your LDL, bad cholesterol is 162. Ideally, this should be less than 100. It is important that you exercise no less than five days weekly, avoid fried foods and increase fish intake.   Your a1c is 6.0, this is in the prediabetes range. Please avoid sugary beverages and processed foods. These include sweet tea, diet drinks, sodas, white breads, rice and pasta.  Your blood count is normal.   Please let me know if you have any questions.    Sincerely,    Robyn N. Baird [facility], MD

## 2018-01-23 ENCOUNTER — Ambulatory Visit: Payer: Medicare Other | Admitting: Nurse Practitioner

## 2018-01-23 ENCOUNTER — Encounter: Payer: Self-pay | Admitting: Nurse Practitioner

## 2018-01-23 VITALS — BP 138/78 | HR 67 | Temp 98.4°F | Ht 65.0 in | Wt 213.6 lb

## 2018-01-23 DIAGNOSIS — J209 Acute bronchitis, unspecified: Secondary | ICD-10-CM

## 2018-01-23 MED ORDER — PREDNISONE 10 MG PO TABS: ORAL_TABLET | ORAL | 0 refills | Status: DC

## 2018-01-23 MED ORDER — HYDROCODONE-HOMATROPINE 5-1.5 MG/5ML PO SYRP: 5.0000 mL | ORAL_SOLUTION | Freq: Four times a day (QID) | ORAL | 0 refills | Status: DC | PRN

## 2018-01-23 NOTE — Patient Instructions (Signed)

## 2018-01-23 NOTE — Progress Notes (Addendum)
  Subjective:     Amy Serrano is a 72 y.o. female who presents for evaluation of symptoms of a URI. Symptoms include congestion, cough described as productive of yellow sputum and post nasal drip. Onset of symptoms was 3 days ago, and has been gradually worsening since that time. Treatment to date: nyquil.  The following portions of the patient's history were reviewed and updated as appropriate: allergies, current medications, past family history, past medical history, past social history, past surgical history and problem list.   Vitals:   01/23/18 1418  BP: 138/78  Pulse: 67  Temp: 98.4 F (36.9 C)  SpO2: 97%    Review of Systems Constitutional: negative except for fatigue Eyes: negative Ears, nose, mouth, throat, and face: negative except for nasal congestion, sore throat and post nasal drip  Respiratory: negative except for cough and pleurisy/chest pain Cardiovascular: negative   Objective:    General appearance: alert, cooperative and no distress Head: Normocephalic, without obvious abnormality, atraumatic, sinuses nontender to percussion Eyes: conjunctivae/corneas clear. PERRL, EOM's intact. Fundi benign. Ears: abnormal TM left ear - could not see Nose: Nares normal. Septum midline. Mucosa normal. No drainage or sinus tenderness., no sinus tenderness Throat: lips, mucosa, and tongue normal; teeth and gums normal Neck: no adenopathy, no carotid bruit, no JVD, supple, symmetrical, trachea midline and thyroid not enlarged, symmetric, no tenderness/mass/nodules Lungs: clear to auscultation bilaterally Heart: regular rate and rhythm, S1, S2 normal, no murmur, click, rub or gallop Lymph nodes: Cervical, supraclavicular, and axillary nodes normal.   Assessment:    bronchitis and viral upper respiratory illness   Plan:    Discussed diagnosis and treatment of URI. Suggested symptomatic OTC remedies. Nasal saline spray for congestion. Follow up as needed.   Will treat  with prednisone once daily Also provided Hydromet discussed risk to avoid driving and keeping out of reach of children

## 2018-02-18 NOTE — Addendum Note (Signed)
Addended by: Minette Brine F on: 02/18/2018 09:05 PM   Modules accepted: Level of Service

## 2018-03-24 ENCOUNTER — Other Ambulatory Visit: Payer: Self-pay | Admitting: Internal Medicine

## 2018-06-19 ENCOUNTER — Encounter: Payer: Self-pay | Admitting: Internal Medicine

## 2018-06-19 ENCOUNTER — Ambulatory Visit: Payer: Medicare Other | Admitting: Internal Medicine

## 2018-06-19 ENCOUNTER — Other Ambulatory Visit: Payer: Self-pay | Admitting: Internal Medicine

## 2018-06-19 ENCOUNTER — Other Ambulatory Visit: Payer: Self-pay

## 2018-06-19 VITALS — BP 140/82 | HR 66 | Temp 98.3°F | Ht 65.0 in | Wt 216.0 lb

## 2018-06-19 DIAGNOSIS — Z6835 Body mass index (BMI) 35.0-35.9, adult: Secondary | ICD-10-CM

## 2018-06-19 DIAGNOSIS — R7309 Other abnormal glucose: Secondary | ICD-10-CM

## 2018-06-19 DIAGNOSIS — I129 Hypertensive chronic kidney disease with stage 1 through stage 4 chronic kidney disease, or unspecified chronic kidney disease: Secondary | ICD-10-CM | POA: Diagnosis not present

## 2018-06-19 DIAGNOSIS — E2839 Other primary ovarian failure: Secondary | ICD-10-CM | POA: Diagnosis not present

## 2018-06-19 DIAGNOSIS — Z1231 Encounter for screening mammogram for malignant neoplasm of breast: Secondary | ICD-10-CM

## 2018-06-19 DIAGNOSIS — N182 Chronic kidney disease, stage 2 (mild): Secondary | ICD-10-CM

## 2018-06-19 NOTE — Patient Instructions (Signed)

## 2018-06-19 NOTE — Progress Notes (Signed)
Subjective:     Patient ID: Amy Serrano , female    DOB: [birth year] , 73 y.o.   MRN: [id number]   Chief Complaint  Patient presents with  . Hypertension    HPI  Hypertension  This is a chronic problem. The current episode started more than 1 year ago. The problem has been gradually improving since onset. The problem is controlled. Pertinent negatives include no blurred vision, chest pain, palpitations or shortness of breath. Risk factors for coronary artery disease include obesity, post-menopausal state and sedentary lifestyle. The current treatment provides moderate improvement. Hypertensive end-organ damage includes kidney disease.     Past Medical History:  Diagnosis Date  . Endometriosis   . Hyperlipidemia   . Hypertension      Family History  Problem Relation Age of Onset  . Hypertension Other   . Stroke Other   . Hypertension Mother   . Heart attack Father      Current Outpatient Medications:  .  amLODipine (NORVASC) 10 MG tablet, TAKE 1 TABLET BY MOUTH DAILY, Disp: 90 tablet, Rfl: 1 .  aspirin 81 MG tablet, Take 81 mg by mouth at bedtime. , Disp: , Rfl:  .  hydrochlorothiazide (HYDRODIURIL) 12.5 MG tablet, Take 12.5 mg by mouth 3 (three) times a week., Disp: , Rfl: 2 .  Multiple Vitamin (MULTIVITAMIN) tablet, Take 1 tablet by mouth daily., Disp: , Rfl:    Allergies  Allergen Reactions  . Benazepril Swelling    Swelling of the face   . Ace Inhibitors     Angioedema     Review of Systems  Constitutional: Negative.   Eyes: Negative for blurred vision.  Respiratory: Negative.  Negative for shortness of breath.   Cardiovascular: Negative.  Negative for chest pain and palpitations.  Gastrointestinal: Negative.   Neurological: Negative.   Psychiatric/Behavioral: Negative.      Today's Vitals   06/19/18 0927  BP: 140/82  Pulse: 66  Temp: 98.3 F (36.8 C)  TempSrc: Oral  Weight: 216 lb (98 kg)  Height: _0  (1.651 m)  PainSc: 0-No pain   Body  mass index is 35.94 kg/m.   Objective:  Physical Exam Vitals signs and nursing note reviewed.  Constitutional:      Appearance: Normal appearance.  HENT:     Head: Normocephalic and atraumatic.  Cardiovascular:     Rate and Rhythm: Normal rate and regular rhythm.     Heart sounds: Normal heart sounds.  Pulmonary:     Effort: Pulmonary effort is normal.     Breath sounds: Normal breath sounds.  Skin:    General: Skin is warm.  Neurological:     General: No focal deficit present.     Mental Status: She is alert.  Psychiatric:        Mood and Affect: Mood normal.        Behavior: Behavior normal.         Assessment And Plan:     1. Hypertensive nephropathy  Fair control. She will continue with current meds for now. She is encouraged to avoid adding salt to her foods. She is also advised to exercise 30 minutes no less than four to five days per week. She will rto in four months for re-evaluation.   - Hepatitis C antibody - Lipid panel - CMP14+EGFR  2. Chronic renal disease, stage II  Chronic. I will check a GFR, Cr today. She is encouraged to stay well hydrated.   3. Other abnormal glucose  HER A1C HAS BEEN ELEVATED IN THE PAST. I WILL CHECK AN A1C, BMET TODAY. SHE WAS ENCOURAGED TO AVOID SUGARY BEVERAGES AND PROCESSED FOODS INCLUDNG BREADS, RICE AND PASTA.  - Hemoglobin A1c  4. Estrogen deficiency  I will refer her for a bone density exam.  She is encouraged to engage in weight-bearing exercises at least three days weekly. Importance of calcium and vitamin d supplementation was discussed with the patient.   5. Breast cancer screening by mammogram  [facility], last one performed in 2017.   6. Class 2 severe obesity due to excess calories with serious comorbidity and body mass index (BMI) of 35.0 to 35.9 in adult [facility])  Importance of achieving optimal weight to decrease risk of cardiovascular disease and cancers was  discussed with the patient in full detail. She is encouraged to start slowly - start with 10 minutes twice daily at least three to four days per week and to gradually build to 30 minutes five days weekly. She was given tips to incorporate more activity into her daily routine - take stairs when possible, park farther away from grocery stores, etc.      Maximino Greenland, MD    THE PATIENT IS ENCOURAGED TO PRACTICE SOCIAL DISTANCING DUE TO THE COVID-19 PANDEMIC.

## 2018-06-20 LAB — CMP14+EGFR
ALT: 17 IU/L (ref 0–32)
AST: 19 IU/L (ref 0–40)
Albumin/Globulin Ratio: 1.6 (ref 1.2–2.2)
Albumin: 4.3 g/dL (ref 3.7–4.7)
Alkaline Phosphatase: 79 IU/L (ref 39–117)
BUN/Creatinine Ratio: 18 (ref 12–28)
BUN: 16 mg/dL (ref 8–27)
Bilirubin Total: 0.3 mg/dL (ref 0.0–1.2)
CO2: 23 mmol/L (ref 20–29)
Calcium: 9.8 mg/dL (ref 8.7–10.3)
Chloride: 102 mmol/L (ref 96–106)
Creatinine, Ser: 0.88 mg/dL (ref 0.57–1.00)
GFR calc Af Amer: 75 mL/min/{1.73_m2} (ref >59)
GFR calc non Af Amer: 65 mL/min/{1.73_m2} (ref >59)
Globulin, Total: 2.7 g/dL (ref 1.5–4.5)
Glucose: 111 mg/dL — ABNORMAL HIGH (ref 65–99)
Potassium: 4.3 mmol/L (ref 3.5–5.2)
Sodium: 141 mmol/L (ref 134–144)
Total Protein: 7 g/dL (ref 6.0–8.5)

## 2018-06-20 LAB — LIPID PANEL
Chol/HDL Ratio: 4.6 ratio — ABNORMAL HIGH (ref 0.0–4.4)
Cholesterol, Total: 226 mg/dL — ABNORMAL HIGH (ref 100–199)
HDL: 49 mg/dL (ref >39)
LDL Calculated: 151 mg/dL — ABNORMAL HIGH (ref 0–99)
Triglycerides: 131 mg/dL (ref 0–149)
VLDL Cholesterol Cal: 26 mg/dL (ref 5–40)

## 2018-06-20 LAB — HEMOGLOBIN A1C
Est. average glucose Bld gHb Est-mCnc: 126 mg/dL
Hgb A1c MFr Bld: 6 % — ABNORMAL HIGH (ref 4.8–5.6)

## 2018-06-20 LAB — HEPATITIS C ANTIBODY: Hep C Virus Ab: <0.1 s/co ratio (ref 0.0–0.9)

## 2018-09-12 ENCOUNTER — Other Ambulatory Visit: Payer: Self-pay | Admitting: Internal Medicine

## 2018-10-06 ENCOUNTER — Ambulatory Visit: Payer: Medicare Other

## 2018-10-06 ENCOUNTER — Other Ambulatory Visit: Payer: Medicare Other

## 2018-11-19 ENCOUNTER — Ambulatory Visit
Admission: RE | Admit: 2018-11-19 | Discharge: 2018-11-19 | Disposition: A | Discharge status: Home / Self Care | Payer: Medicare Other | Source: Ambulatory Visit | Attending: Internal Medicine | Admitting: Internal Medicine

## 2018-11-19 ENCOUNTER — Other Ambulatory Visit: Payer: Self-pay

## 2018-11-19 DIAGNOSIS — Z1231 Encounter for screening mammogram for malignant neoplasm of breast: Secondary | ICD-10-CM

## 2018-11-26 ENCOUNTER — Ambulatory Visit (INDEPENDENT_AMBULATORY_CARE_PROVIDER_SITE_OTHER): Payer: Medicare Other

## 2018-11-26 ENCOUNTER — Other Ambulatory Visit: Payer: Self-pay

## 2018-11-26 ENCOUNTER — Ambulatory Visit: Payer: Medicare Other

## 2018-11-26 VITALS — BP 142/82 | HR 74 | Temp 98.5°F | Ht 64.8 in | Wt 209.2 lb

## 2018-11-26 DIAGNOSIS — Z23 Encounter for immunization: Secondary | ICD-10-CM | POA: Diagnosis not present

## 2018-11-26 DIAGNOSIS — I129 Hypertensive chronic kidney disease with stage 1 through stage 4 chronic kidney disease, or unspecified chronic kidney disease: Secondary | ICD-10-CM

## 2018-11-26 DIAGNOSIS — Z0000 Encounter for general adult medical examination without abnormal findings: Secondary | ICD-10-CM

## 2018-11-26 LAB — POCT UA - MICROALBUMIN
Albumin/Creatinine Ratio, Urine, POC: <30
Creatinine, POC: 200 mg/dL
Microalbumin Ur, POC: 10 mg/L

## 2018-11-26 LAB — POCT URINALYSIS DIPSTICK
Bilirubin, UA: NEGATIVE
Blood, UA: NEGATIVE
Glucose, UA: NEGATIVE
Ketones, UA: NEGATIVE
Leukocytes, UA: NEGATIVE
Nitrite, UA: NEGATIVE
Protein, UA: NEGATIVE
Spec Grav, UA: 1.02 (ref 1.010–1.025)
Urobilinogen, UA: 0.2 E.U./dL
pH, UA: 5.5 (ref 5.0–8.0)

## 2018-11-26 NOTE — Patient Instructions (Signed)
Amy Serrano , Thank you for taking time to come for your Medicare Wellness Visit. I appreciate your ongoing commitment to your health goals. Please review the following plan we discussed and let me know if I can assist you in the future.   Screening recommendations/referrals: Colonoscopy: 12/2014 Mammogram: 11/2018 Bone Density: 02/2016 Recommended yearly ophthalmology/optometry visit for glaucoma screening and checkup Recommended yearly dental visit for hygiene and checkup  Vaccinations: Influenza vaccine: today Pneumococcal vaccine: 03/2012 Tdap vaccine: patient checking Shingles vaccine: discussed    Advanced directives: Advance directive discussed with you today. Even though you declined this today please call our office should you change your mind and we can give you the proper paperwork for you to fill out.   Conditions/risks identified: obesity  Next appointment: 01/27/2019 at 10:30   Preventive Care 74 Years and Older, Female Preventive care refers to lifestyle choices and visits with your health care provider that can promote health and wellness. What does preventive care include?  A yearly physical exam. This is also called an annual well check.  Dental exams once or twice a year.  Routine eye exams. Ask your health care provider how often you should have your eyes checked.  Personal lifestyle choices, including:  Daily care of your teeth and gums.  Regular physical activity.  Eating a healthy diet.  Avoiding tobacco and drug use.  Limiting alcohol use.  Practicing safe sex.  Taking low-dose aspirin every day.  Taking vitamin and mineral supplements as recommended by your health care provider. What happens during an annual well check? The services and screenings done by your health care provider during your annual well check will depend on your age, overall health, lifestyle risk factors, and family history of disease. Counseling  Your health care  provider may ask you questions about your:  Alcohol use.  Tobacco use.  Drug use.  Emotional well-being.  Home and relationship well-being.  Sexual activity.  Eating habits.  History of falls.  Memory and ability to understand (cognition).  Work and work Statistician.  Reproductive health. Screening  You may have the following tests or measurements:  Height, weight, and BMI.  Blood pressure.  Lipid and cholesterol levels. These may be checked every 5 years, or more frequently if you are over 72 years old.  Skin check.  Lung cancer screening. You may have this screening every year starting at age 74 if you have a 30-pack-year history of smoking and currently smoke or have quit within the past 15 years.  Fecal occult blood test (FOBT) of the stool. You may have this test every year starting at age 74.  Flexible sigmoidoscopy or colonoscopy. You may have a sigmoidoscopy every 5 years or a colonoscopy every 10 years starting at age 74.  Hepatitis C blood test.  Hepatitis B blood test.  Sexually transmitted disease (STD) testing.  Diabetes screening. This is done by checking your blood sugar (glucose) after you have not eaten for a while (fasting). You may have this done every 1-3 years.  Bone density scan. This is done to screen for osteoporosis. You may have this done starting at age 74.  Mammogram. This may be done every 1-2 years. Talk to your health care provider about how often you should have regular mammograms. Talk with your health care provider about your test results, treatment options, and if necessary, the need for more tests. Vaccines  Your health care provider may recommend certain vaccines, such as:  Influenza vaccine. This is recommended  every year.  Tetanus, diphtheria, and acellular pertussis (Tdap, Td) vaccine. You may need a Td booster every 10 years.  Zoster vaccine. You may need this after age 74.  Pneumococcal 13-valent conjugate (PCV13)  vaccine. One dose is recommended after age 74.  Pneumococcal polysaccharide (PPSV23) vaccine. One dose is recommended after age 55. Talk to your health care provider about which screenings and vaccines you need and how often you need them. This information is not intended to replace advice given to you by your health care provider. Make sure you discuss any questions you have with your health care provider. Document Released: 03/18/2015 Document Revised: 11/09/2015 Document Reviewed: 12/21/2014 Elsevier Interactive Patient Education  2017 [employer] Prevention in the Home Falls can cause injuries. They can happen to people of all ages. There are many things you can do to make your home safe and to help prevent falls. What can I do on the outside of my home?  Regularly fix the edges of walkways and driveways and fix any cracks.  Remove anything that might make you trip as you walk through a door, such as a raised step or threshold.  Trim any bushes or trees on the path to your home.  Use bright outdoor lighting.  Clear any walking paths of anything that might make someone trip, such as rocks or tools.  Regularly check to see if handrails are loose or broken. Make sure that both sides of any steps have handrails.  Any raised decks and porches should have guardrails on the edges.  Have any leaves, snow, or ice cleared regularly.  Use sand or salt on walking paths during winter.  Clean up any spills in your garage right away. This includes oil or grease spills. What can I do in the bathroom?  Use night lights.  Install grab bars by the toilet and in the tub and shower. Do not use towel bars as grab bars.  Use non-skid mats or decals in the tub or shower.  If you need to sit down in the shower, use a plastic, non-slip stool.  Keep the floor dry. Clean up any water that spills on the floor as soon as it happens.  Remove soap buildup in the tub or shower regularly.   Attach bath mats securely with double-sided non-slip rug tape.  Do not have throw rugs and other things on the floor that can make you trip. What can I do in the bedroom?  Use night lights.  Make sure that you have a light by your bed that is easy to reach.  Do not use any sheets or blankets that are too big for your bed. They should not hang down onto the floor.  Have a firm chair that has side arms. You can use this for support while you get dressed.  Do not have throw rugs and other things on the floor that can make you trip. What can I do in the kitchen?  Clean up any spills right away.  Avoid walking on wet floors.  Keep items that you use a lot in easy-to-reach places.  If you need to reach something above you, use a strong step stool that has a grab bar.  Keep electrical cords out of the way.  Do not use floor polish or wax that makes floors slippery. If you must use wax, use non-skid floor wax.  Do not have throw rugs and other things on the floor that can make you trip. What  can I do with my stairs?  Do not leave any items on the stairs.  Make sure that there are handrails on both sides of the stairs and use them. Fix handrails that are broken or loose. Make sure that handrails are as long as the stairways.  Check any carpeting to make sure that it is firmly attached to the stairs. Fix any carpet that is loose or worn.  Avoid having throw rugs at the top or bottom of the stairs. If you do have throw rugs, attach them to the floor with carpet tape.  Make sure that you have a light switch at the top of the stairs and the bottom of the stairs. If you do not have them, ask someone to add them for you. What else can I do to help prevent falls?  Wear shoes that:  Do not have high heels.  Have rubber bottoms.  Are comfortable and fit you well.  Are closed at the toe. Do not wear sandals.  If you use a stepladder:  Make sure that it is fully opened. Do not climb  a closed stepladder.  Make sure that both sides of the stepladder are locked into place.  Ask someone to hold it for you, if possible.  Clearly mark and make sure that you can see:  Any grab bars or handrails.  First and last steps.  Where the edge of each step is.  Use tools that help you move around (mobility aids) if they are needed. These include:  Canes.  Walkers.  Scooters.  Crutches.  Turn on the lights when you go into a dark area. Replace any light bulbs as soon as they burn out.  Set up your furniture so you have a clear path. Avoid moving your furniture around.  If any of your floors are uneven, fix them.  If there are any pets around you, be aware of where they are.  Review your medicines with your doctor. Some medicines can make you feel dizzy. This can increase your chance of falling. Ask your doctor what other things that you can do to help prevent falls. This information is not intended to replace advice given to you by your health care provider. Make sure you discuss any questions you have with your health care provider. Document Released: 12/16/2008 Document Revised: 07/28/2015 Document Reviewed: 03/26/2014 Elsevier Interactive Patient Education  2017 [employer].

## 2018-11-26 NOTE — Progress Notes (Signed)
Subjective:   Amy Serrano is a 73 y.o. female who presents for Medicare Annual (Subsequent) preventive examination.  Review of Systems:  n/a Cardiac Risk Factors include: advanced age (>40men, >38 women);hypertension;obesity (BMI >30kg/m2)     Objective:     Vitals: BP (!) 142/82 (BP Location: Left Arm, Patient Position: Sitting, Cuff Size: Normal)   Pulse 74   Temp 98.5 F (36.9 C) (Oral)   Ht 5' 4.8" (1.646 m)   Wt 209 lb 3.2 oz (94.9 kg)   SpO2 98%   BMI 35.03 kg/m   Body mass index is 35.03 kg/m.  Advanced Directives 11/26/2018 01/01/2018 08/05/2015 08/07/2014 08/01/2014 05/18/2014  Does Patient Have a Medical Advance Directive? No Yes No No No No  Type of Advance Directive - [place]  Does patient want to make changes to medical advance directive? - No - Patient declined - - - -  Copy of [place] in Chart? - No - copy requested - - - -  Would patient like information on creating a medical advance directive? - - No - patient declined information No - patient declined information No - patient declined information No - patient declined information    Tobacco Social History   Tobacco Use  Smoking Status Never Smoker  Smokeless Tobacco Never Used     Counseling given: Not Answered   Clinical Intake:  Pre-visit preparation completed: Yes  Pain : No/denies pain     Nutritional Status: BMI > 30  Obese Nutritional Risks: None Diabetes: No  How often do you need to have someone help you when you read instructions, pamphlets, or other written materials from your doctor or pharmacy?: 1 - Never What is the last grade level you completed in school?: 2 years college  Interpreter Needed?: No  Information entered by :: NAllen LPN  Past Medical History:  Diagnosis Date  . Endometriosis   . Hyperlipidemia   . Hypertension    Past Surgical History:  Procedure Laterality Date  . arm surgery     left   Family  History  Problem Relation Age of Onset  . Hypertension Other   . Stroke Other   . Hypertension Mother   . Heart attack Father    Social History   Socioeconomic History  . Marital status: Married    Spouse name: Not on file  . Number of children: Not on file  . Years of education: Not on file  . Highest education level: Not on file  Occupational History  . Occupation: retired  Scientific laboratory technician  . Financial resource strain: Not hard at all  . Food insecurity    Worry: Never true    Inability: Never true  . Transportation needs    Medical: No    Non-medical: No  Tobacco Use  . Smoking status: Never Smoker  . Smokeless tobacco: Never Used  Substance and Sexual Activity  . Alcohol use: No  . Drug use: No  . Sexual activity: Not Currently  Lifestyle  . Physical activity    Days per week: 7 days    Minutes per session: 10 min  . Stress: To some extent  Relationships  . Social Herbalist on phone: Not on file    Gets together: Not on file    Attends religious service: Not on file    Active member of club or organization: Not on file    Attends meetings of  clubs or organizations: Not on file    Relationship status: Not on file  Other Topics Concern  . Not on file  Social History Narrative  . Not on file    Outpatient Encounter Medications as of 11/26/2018  Medication Sig  . amLODipine (NORVASC) 10 MG tablet TAKE 1 TABLET BY MOUTH DAILY  . aspirin 81 MG tablet Take 81 mg by mouth at bedtime.   . hydrochlorothiazide (HYDRODIURIL) 12.5 MG tablet Take 12.5 mg by mouth 3 (three) times a week.  . Multiple Vitamin (MULTIVITAMIN) tablet Take 1 tablet by mouth daily.  . [DISCONTINUED] amLODipine-benazepril (LOTREL) 10-20 MG per capsule Take 1 capsule by mouth daily.  . [DISCONTINUED] dicyclomine (BENTYL) 10 MG capsule Take 2 capsules (20 mg total) by mouth 3 (three) times daily between meals as needed for spasms. (Patient not taking: Reported on 08/05/2015)  .  [DISCONTINUED] diphenhydrAMINE (BENADRYL) 25 MG tablet Take 1 tablet (25 mg total) by mouth every 6 (six) hours as needed for itching (Rash). (Patient not taking: Reported on 08/05/2015)  . [DISCONTINUED] famotidine (PEPCID) 20 MG tablet Take 1 tablet (20 mg total) by mouth 2 (two) times daily. (Patient not taking: Reported on 08/05/2015)  . [DISCONTINUED] metoCLOPramide (REGLAN) 10 MG tablet Take 1 tablet (10 mg total) by mouth every 6 (six) hours as needed for nausea (nausea/headache). (Patient not taking: Reported on 08/05/2015)   No facility-administered encounter medications on file as of 11/26/2018.     Activities of Daily Living In your present state of health, do you have any difficulty performing the following activities: 11/26/2018 01/01/2018  Hearing? N N  Vision? N N  Difficulty concentrating or making decisions? N N  Walking or climbing stairs? N N  Dressing or bathing? N N  Doing errands, shopping? N N  Preparing Food and eating ? N N  Using the Toilet? N N  In the past six months, have you accidently leaked urine? Y Y  Comment - rare  Do you have problems with loss of bowel control? N N  Managing your Medications? N N  Managing your Finances? N N  Housekeeping or managing your Housekeeping? N N  Some recent data might be hidden    Patient Care Team: Glendale Chard, MD as PCP - General (Internal Medicine) Marylynn Pearson, MD as Consulting Physician (Ophthalmology)    Assessment:   This is a routine wellness examination for [facility].  Exercise Activities and Dietary recommendations Current Exercise Habits: Home exercise routine, Type of exercise: walking;strength training/weights, Time (Minutes): 10, Frequency (Times/Week): 7, Weekly Exercise (Minutes/Week): 70  Goals    . Patient Stated     11/26/2018, stay healthy    . Weight (lb) < 200 lb (90.7 kg) (pt-stated)       Fall Risk Fall Risk  11/26/2018 06/19/2018 01/23/2018 01/01/2018 05/18/2014  Falls in the past year? 0 0 0  No No  Number falls in past yr: 0 - - - -  Risk for fall due to : Medication side effect - - Medication side effect -  Follow up Falls evaluation completed;Education provided;Falls prevention discussed - - - -   Is the patient's home free of loose throw rugs in walkways, pet beds, electrical cords, etc?   yes      Grab bars in the bathroom? no      Handrails on the stairs?  n/a      Adequate lighting?   yes  Timed Get Up and Go performed: n/a  Depression Screen PHQ  2/9 Scores 11/26/2018 06/19/2018 01/23/2018 01/01/2018  PHQ - 2 Score 0 0 0 0  PHQ- 9 Score 0 - - -     Cognitive Function     6CIT Screen 11/26/2018 01/01/2018  What Year? 0 points 0 points  What month? 0 points 0 points  What time? 0 points 0 points  Count back from 20 0 points 0 points  Months in reverse 0 points 2 points  Repeat phrase 2 points 0 points  Total Score 2 2    Immunization History  Administered Date(s) Administered  . Influenza-Unspecified 12/03/2013, 11/28/2017    Qualifies for Shingles Vaccine? yes  Screening Tests Health Maintenance  Topic Date Due  . FOOT EXAM  [birth year]10/1955  . OPHTHALMOLOGY EXAM  [birth year]10/1955  . URINE MICROALBUMIN  08/10/1954  . TETANUS/TDAP  08/10/1963  . PNA vac Low Risk Adult (2 of 2 - PPSV23) 03/12/2013  . INFLUENZA VACCINE  10/04/2018  . HEMOGLOBIN A1C  12/19/2018  . MAMMOGRAM  11/18/2020  . COLONOSCOPY  12/07/2024  . DEXA SCAN  Completed  . Hepatitis C Screening  Completed    Cancer Screenings: Lung: Low Dose CT Chest recommended if Age 54-80 years, 30 pack-year currently smoking OR have quit w/in 15years. Patient does not qualify. Breast:  Up to date on Mammogram? Yes   Up to date of Bone Density/Dexa? Yes Colorectal: up to date  Additional Screenings: : Hepatitis C Screening: 06/2018     Plan:    Patient wants to stay healthy.   I have personally reviewed and noted the following in the patient's chart:   . Medical and social history . Use of  alcohol, tobacco or illicit drugs  . Current medications and supplements . Functional ability and status . Nutritional status . Physical activity . Advanced directives . List of other physicians . Hospitalizations, surgeries, and ER visits in previous 12 months . Vitals . Screenings to include cognitive, depression, and falls . Referrals and appointments  In addition, I have reviewed and discussed with patient certain preventive protocols, quality metrics, and best practice recommendations. A written personalized care plan for preventive services as well as general preventive health recommendations were provided to patient.     Kellie Simmering, LPN  [id number]

## 2018-12-16 ENCOUNTER — Other Ambulatory Visit: Payer: Self-pay

## 2018-12-16 ENCOUNTER — Ambulatory Visit
Admission: RE | Admit: 2018-12-16 | Discharge: 2018-12-16 | Disposition: A | Discharge status: Home / Self Care | Payer: Medicare Other | Source: Ambulatory Visit | Attending: Internal Medicine | Admitting: Internal Medicine

## 2018-12-16 DIAGNOSIS — E2839 Other primary ovarian failure: Secondary | ICD-10-CM

## 2019-01-07 ENCOUNTER — Ambulatory Visit: Payer: Medicare Other

## 2019-01-07 ENCOUNTER — Ambulatory Visit: Payer: Medicare Other | Admitting: Internal Medicine

## 2019-01-27 ENCOUNTER — Ambulatory Visit: Payer: Medicare Other | Admitting: Internal Medicine

## 2019-01-27 ENCOUNTER — Ambulatory Visit: Payer: Medicare Other

## 2019-01-27 ENCOUNTER — Encounter: Payer: Self-pay | Admitting: Internal Medicine

## 2019-01-27 ENCOUNTER — Other Ambulatory Visit: Payer: Self-pay

## 2019-01-27 VITALS — BP 142/94 | HR 74 | Temp 98.4°F | Ht 66.0 in | Wt 206.6 lb

## 2019-01-27 DIAGNOSIS — I129 Hypertensive chronic kidney disease with stage 1 through stage 4 chronic kidney disease, or unspecified chronic kidney disease: Secondary | ICD-10-CM

## 2019-01-27 DIAGNOSIS — Z0000 Encounter for general adult medical examination without abnormal findings: Secondary | ICD-10-CM

## 2019-01-27 DIAGNOSIS — H6122 Impacted cerumen, left ear: Secondary | ICD-10-CM

## 2019-01-27 DIAGNOSIS — N182 Chronic kidney disease, stage 2 (mild): Secondary | ICD-10-CM

## 2019-01-27 DIAGNOSIS — E6609 Other obesity due to excess calories: Secondary | ICD-10-CM

## 2019-01-27 DIAGNOSIS — R7309 Other abnormal glucose: Secondary | ICD-10-CM

## 2019-01-27 DIAGNOSIS — Z6833 Body mass index (BMI) 33.0-33.9, adult: Secondary | ICD-10-CM

## 2019-01-27 LAB — POCT UA - MICROALBUMIN
Albumin/Creatinine Ratio, Urine, POC: 30
Creatinine, POC: 50 mg/dL
Microalbumin Ur, POC: 10 mg/L

## 2019-01-28 LAB — CMP14+EGFR
ALT: 16 IU/L (ref 0–32)
AST: 13 IU/L (ref 0–40)
Albumin/Globulin Ratio: 1.6 (ref 1.2–2.2)
Albumin: 4.4 g/dL (ref 3.7–4.7)
Alkaline Phosphatase: 58 IU/L (ref 39–117)
BUN/Creatinine Ratio: 10 — ABNORMAL LOW (ref 12–28)
BUN: 8 mg/dL (ref 8–27)
Bilirubin Total: 0.2 mg/dL (ref 0.0–1.2)
CO2: 23 mmol/L (ref 20–29)
Calcium: 9 mg/dL (ref 8.7–10.3)
Chloride: 107 mmol/L — ABNORMAL HIGH (ref 96–106)
Creatinine, Ser: 0.78 mg/dL (ref 0.57–1.00)
GFR calc Af Amer: 87 mL/min/{1.73_m2} (ref >59)
GFR calc non Af Amer: 75 mL/min/{1.73_m2} (ref >59)
Globulin, Total: 2.8 g/dL (ref 1.5–4.5)
Glucose: 71 mg/dL (ref 65–99)
Potassium: 4 mmol/L (ref 3.5–5.2)
Sodium: 141 mmol/L (ref 134–144)
Total Protein: 7.2 g/dL (ref 6.0–8.5)

## 2019-01-28 LAB — LIPID PANEL
Chol/HDL Ratio: 4.2 ratio (ref 0.0–4.4)
Cholesterol, Total: 245 mg/dL — ABNORMAL HIGH (ref 100–199)
HDL: 58 mg/dL (ref >39)
LDL Chol Calc (NIH): 160 mg/dL — ABNORMAL HIGH (ref 0–99)
Triglycerides: 150 mg/dL — ABNORMAL HIGH (ref 0–149)
VLDL Cholesterol Cal: 27 mg/dL (ref 5–40)

## 2019-01-28 LAB — CBC
Hematocrit: 37.9 % (ref 34.0–46.6)
Hemoglobin: 12.3 g/dL (ref 11.1–15.9)
MCH: 28.7 pg (ref 26.6–33.0)
MCHC: 32.5 g/dL (ref 31.5–35.7)
MCV: 88 fL (ref 79–97)
Platelets: 362 10*3/uL (ref 150–450)
RBC: 4.29 x10E6/uL (ref 3.77–5.28)
RDW: 13.5 % (ref 11.7–15.4)
WBC: 7.2 10*3/uL (ref 3.4–10.8)

## 2019-01-28 LAB — TSH: TSH: 2 u[IU]/mL (ref 0.450–4.500)

## 2019-01-28 LAB — HEMOGLOBIN A1C
Est. average glucose Bld gHb Est-mCnc: 131 mg/dL
Hgb A1c MFr Bld: 6.2 % — ABNORMAL HIGH (ref 4.8–5.6)

## 2019-01-30 ENCOUNTER — Encounter: Payer: Self-pay | Admitting: Internal Medicine

## 2019-01-30 NOTE — Patient Instructions (Signed)
Health Maintenance, Female Adopting a healthy lifestyle and getting preventive care are important in promoting health and wellness. Ask your health care provider about:  The right schedule for you to have regular tests and exams.  Things you can do on your own to prevent diseases and keep yourself healthy. What should I know about diet, weight, and exercise? Eat a healthy diet   Eat a diet that includes plenty of vegetables, fruits, low-fat dairy products, and lean protein.  Do not eat a lot of foods that are high in solid fats, added sugars, or sodium. Maintain a healthy weight Body mass index (BMI) is used to identify weight problems. It estimates body fat based on height and weight. Your health care provider can help determine your BMI and help you achieve or maintain a healthy weight. Get regular exercise Get regular exercise. This is one of the most important things you can do for your health. Most adults should:  Exercise for at least 150 minutes each week. The exercise should increase your heart rate and make you sweat (moderate-intensity exercise).  Do strengthening exercises at least twice a week. This is in addition to the moderate-intensity exercise.  Spend less time sitting. Even light physical activity can be beneficial. Watch cholesterol and blood lipids Have your blood tested for lipids and cholesterol at 74 years of age, then have this test every 5 years. Have your cholesterol levels checked more often if:  Your lipid or cholesterol levels are high.  You are older than 74 years of age.  You are at high risk for heart disease. What should I know about cancer screening? Depending on your health history and family history, you may need to have cancer screening at various ages. This may include screening for:  Breast cancer.  Cervical cancer.  Colorectal cancer.  Skin cancer.  Lung cancer. What should I know about heart disease, diabetes, and high blood  pressure? Blood pressure and heart disease  High blood pressure causes heart disease and increases the risk of stroke. This is more likely to develop in people who have high blood pressure readings, are of African descent, or are overweight.  Have your blood pressure checked: ? Every 3-5 years if you are 18-39 years of age. ? Every year if you are 40 years old or older. Diabetes Have regular diabetes screenings. This checks your fasting blood sugar level. Have the screening done:  Once every three years after age 40 if you are at a normal weight and have a low risk for diabetes.  More often and at a younger age if you are overweight or have a high risk for diabetes. What should I know about preventing infection? Hepatitis B If you have a higher risk for hepatitis B, you should be screened for this virus. Talk with your health care provider to find out if you are at risk for hepatitis B infection. Hepatitis C Testing is recommended for:  Everyone born from 1945 through 1965.  Anyone with known risk factors for hepatitis C. Sexually transmitted infections (STIs)  Get screened for STIs, including gonorrhea and chlamydia, if: ? You are sexually active and are younger than 74 years of age. ? You are older than 74 years of age and your health care provider tells you that you are at risk for this type of infection. ? Your sexual activity has changed since you were last screened, and you are at increased risk for chlamydia or gonorrhea. Ask your health care provider if   you are at risk.  Ask your health care provider about whether you are at high risk for HIV. Your health care provider may recommend a prescription medicine to help prevent HIV infection. If you choose to take medicine to prevent HIV, you should first get tested for HIV. You should then be tested every 3 months for as long as you are taking the medicine. Pregnancy  If you are about to stop having your period (premenopausal) and  you may become pregnant, seek counseling before you get pregnant.  Take 400 to 800 micrograms (mcg) of folic acid every day if you become pregnant.  Ask for birth control (contraception) if you want to prevent pregnancy. Osteoporosis and menopause Osteoporosis is a disease in which the bones lose minerals and strength with aging. This can result in bone fractures. If you are 65 years old or older, or if you are at risk for osteoporosis and fractures, ask your health care provider if you should:  Be screened for bone loss.  Take a calcium or vitamin D supplement to lower your risk of fractures.  Be given hormone replacement therapy (HRT) to treat symptoms of menopause. Follow these instructions at home: Lifestyle  Do not use any products that contain nicotine or tobacco, such as cigarettes, e-cigarettes, and chewing tobacco. If you need help quitting, ask your health care provider.  Do not use street drugs.  Do not share needles.  Ask your health care provider for help if you need support or information about quitting drugs. Alcohol use  Do not drink alcohol if: ? Your health care provider tells you not to drink. ? You are pregnant, may be pregnant, or are planning to become pregnant.  If you drink alcohol: ? Limit how much you use to 0-1 drink a day. ? Limit intake if you are breastfeeding.  Be aware of how much alcohol is in your drink. In the [place] one drink equals one 12 oz bottle of beer (355 mL), one 5 oz glass of wine (148 mL), or one 1 oz glass of hard liquor (44 mL). General instructions  Schedule regular health, dental, and eye exams.  Stay current with your vaccines.  Tell your health care provider if: ? You often feel depressed. ? You have ever been abused or do not feel safe at home. Summary  Adopting a healthy lifestyle and getting preventive care are important in promoting health and wellness.  Follow your health care provider's instructions about healthy  diet, exercising, and getting tested or screened for diseases.  Follow your health care provider's instructions on monitoring your cholesterol and blood pressure. This information is not intended to replace advice given to you by your health care provider. Make sure you discuss any questions you have with your health care provider. Document Released: 09/04/2010 Document Revised: 02/12/2018 Document Reviewed: 02/12/2018 Elsevier Patient Education  2020 [employer]  

## 2019-01-30 NOTE — Progress Notes (Signed)
Subjective:     Patient ID: Amy Serrano , female    DOB: 12-Sep-1945 , 73 y.o.   MRN: [id number]   Chief Complaint  Patient presents with  . Annual Exam  . Hypertension    HPI  She is here today for a full physical exam. She is no longer followed by GYN. Unfortunately, since her last visit, her husband has passed. She lost a sister earlier this year as well. She reports that she is doing "ok" and grief counseling is not needed.   Hypertension This is a chronic problem. The current episode started more than 1 year ago. The problem has been gradually improving since onset. The problem is uncontrolled. Pertinent negatives include no blurred vision, chest pain, palpitations or shortness of breath. Past treatments include calcium channel blockers. The current treatment provides moderate improvement. Compliance problems include exercise.      Past Medical History:  Diagnosis Date  . Endometriosis   . Hyperlipidemia   . Hypertension      Family History  Problem Relation Age of Onset  . Hypertension Other   . Stroke Other   . Hypertension Mother   . Heart attack Father      Current Outpatient Medications:  .  amLODipine (NORVASC) 10 MG tablet, TAKE 1 TABLET BY MOUTH DAILY, Disp: 90 tablet, Rfl: 1 .  aspirin 81 MG tablet, Take 81 mg by mouth at bedtime. , Disp: , Rfl:  .  hydrochlorothiazide (HYDRODIURIL) 12.5 MG tablet, Take 12.5 mg by mouth 3 (three) times a week., Disp: , Rfl: 2 .  Multiple Vitamin (MULTIVITAMIN) tablet, Take 1 tablet by mouth daily., Disp: , Rfl:    Allergies  Allergen Reactions  . Benazepril Swelling    Swelling of the face   . Ace Inhibitors     Angioedema      The patient states she uses post menopausal status for birth control. Last LMP was No LMP recorded. Patient is postmenopausal.. Negative for Dysmenorrhea  Negative for: breast discharge, breast lump(s), breast pain and breast self exam. Associated symptoms include abnormal vaginal bleeding.  Pertinent negatives include abnormal bleeding (hematology), anxiety, decreased libido, depression, difficulty falling sleep, dyspareunia, history of infertility, nocturia, sexual dysfunction, sleep disturbances, urinary incontinence, urinary urgency, vaginal discharge and vaginal itching. Diet regular.The patient states her exercise level is  minimal.   . The patient's tobacco use is:  Social History   Tobacco Use  Smoking Status Never Smoker  Smokeless Tobacco Never Used  . She has been exposed to passive smoke. The patient's alcohol use is:  Social History   Substance and Sexual Activity  Alcohol Use No   Review of Systems  Constitutional: Negative.   HENT: Negative.   Eyes: Negative.  Negative for blurred vision.  Respiratory: Negative.  Negative for shortness of breath.   Cardiovascular: Negative.  Negative for chest pain and palpitations.  Endocrine: Negative.   Genitourinary: Negative.   Musculoskeletal: Negative.   Skin: Negative.   Allergic/Immunologic: Negative.   Neurological: Negative.   Hematological: Negative.   Psychiatric/Behavioral: Negative.      Today's Vitals   01/27/19 1029  BP: (!) 142/94  Pulse: 74  Temp: 98.4 F (36.9 C)  TempSrc: Oral  Weight: 206 lb 9.6 oz (93.7 kg)  Height: 5' 6"  (1.676 m)   Body mass index is 33.35 kg/m.   Objective:  Physical Exam Vitals signs and nursing note reviewed.  Constitutional:      Appearance: Normal appearance. She is obese.  HENT:     Head: Normocephalic and atraumatic.     Right Ear: Tympanic membrane, ear canal and external ear normal.     Left Ear: Ear canal and external ear normal. There is impacted cerumen.     Ears:     Comments: Left ear with hard, dry cerumen impaction.    Nose:     Comments: Deferred, masked.     Mouth/Throat:     Comments: Deferred, masked Eyes:     Extraocular Movements: Extraocular movements intact.     Conjunctiva/sclera: Conjunctivae normal.     Pupils: Pupils are equal,  round, and reactive to light.  Neck:     Musculoskeletal: Normal range of motion and neck supple.  Cardiovascular:     Rate and Rhythm: Normal rate and regular rhythm.     Pulses: Normal pulses.     Heart sounds: Normal heart sounds.  Pulmonary:     Effort: Pulmonary effort is normal.     Breath sounds: Normal breath sounds.  Chest:     Breasts: Tanner Score is 5.        Right: Normal.        Left: Normal.  Abdominal:     General: Bowel sounds are normal.     Palpations: Abdomen is soft.  Genitourinary:    Comments: deferred Musculoskeletal: Normal range of motion.  Skin:    General: Skin is warm and dry.  Neurological:     General: No focal deficit present.     Mental Status: She is alert and oriented to person, place, and time.  Psychiatric:        Mood and Affect: Mood normal.        Behavior: Behavior normal.         Assessment And Plan:     1. Routine general medical examination at health care facility  A full exam was performed.  Importance of monthly self breast exams was discussed with the patient.  PATIENT HAS BEEN ADVISED TO GET 30-45 MINUTES REGULAR EXERCISE NO LESS THAN FOUR TO FIVE DAYS PER WEEK - BOTH WEIGHTBEARING EXERCISES AND AEROBIC ARE RECOMMENDED.  SHE WAS ADVISED TO FOLLOW A HEALTHY DIET WITH AT LEAST SIX FRUITS/VEGGIES PER DAY, DECREASE INTAKE OF RED MEAT, AND TO INCREASE FISH INTAKE TO TWO DAYS PER WEEK.  MEATS/FISH SHOULD NOT BE FRIED, BAKED OR BROILED IS PREFERABLE.  I SUGGEST WEARING SPF 50 SUNSCREEN ON EXPOSED PARTS AND ESPECIALLY WHEN IN THE DIRECT SUNLIGHT FOR AN EXTENDED PERIOD OF TIME.  PLEASE AVOID FAST FOOD RESTAURANTS AND INCREASE YOUR WATER INTAKE.   2. Hypertensive nephropathy  Chronic, fair control. She will continue with current meds for now. She is encouraged to avoid adding salt to her foods. EKG performed, no new changes noted. She will rto in six months for re-evaluation.   - EKG 12-Lead - POCT UA - Microalbumin - CMP14+EGFR -  CBC - Lipid panel - TSH  3. Chronic renal disease, stage II  Chronic, yet stable. She is encouraged to stay well hydrated. Importance of optimal bp control to avoid progression of CKD was also discussed with the patient.   4. Left ear impacted cerumen  AFTER OBTAINING VERBAL CONSENT, LEFT EAR WAS FLUSHED BY IRRIGATION. SHE TOLERATED PROCEDURE WELL WITHOUT ANY COMPLICATIONS. NO TM ABNORMALITIES WERE NOTED.  - Ear Lavage  5. Other abnormal glucose  HER A1C HAS BEEN ELEVATED IN THE PAST. I WILL CHECK AN A1C, BMET TODAY. SHE WAS ENCOURAGED TO AVOID SUGARY BEVERAGES AND  PROCESSED FOODS INCLUDNG BREADS, RICE AND PASTA.  - Hemoglobin A1c  6. Class 1 obesity due to excess calories with serious comorbidity and body mass index (BMI) of 33.0 to 33.9 in adult  Importance of achieving optimal weight to decrease risk of cardiovascular disease and cancers was discussed with the patient in full detail. Importance of regular exercise was discussed with the patient.  She is encouraged to start slowly - start with 10 minutes twice daily at least three to four days per week and to gradually build to 30 minutes five days weekly. She was given tips to incorporate more activity into her daily routine - take stairs when possible, park farther away from grocery stores, etc.    Maximino Greenland, MD    THE PATIENT IS ENCOURAGED TO PRACTICE SOCIAL DISTANCING DUE TO THE COVID-19 PANDEMIC.

## 2019-03-16 ENCOUNTER — Other Ambulatory Visit: Payer: Self-pay | Admitting: Internal Medicine

## 2019-04-06 ENCOUNTER — Ambulatory Visit (INDEPENDENT_AMBULATORY_CARE_PROVIDER_SITE_OTHER): Payer: Medicare PPO | Admitting: Internal Medicine

## 2019-04-06 ENCOUNTER — Encounter: Payer: Self-pay | Admitting: Internal Medicine

## 2019-04-06 ENCOUNTER — Other Ambulatory Visit: Payer: Self-pay

## 2019-04-06 VITALS — BP 156/92 | HR 69 | Temp 98.3°F | Ht 66.0 in | Wt 208.0 lb

## 2019-04-06 DIAGNOSIS — M5441 Lumbago with sciatica, right side: Secondary | ICD-10-CM | POA: Diagnosis not present

## 2019-04-06 DIAGNOSIS — N182 Chronic kidney disease, stage 2 (mild): Secondary | ICD-10-CM

## 2019-04-06 DIAGNOSIS — Z6833 Body mass index (BMI) 33.0-33.9, adult: Secondary | ICD-10-CM | POA: Diagnosis not present

## 2019-04-06 DIAGNOSIS — I129 Hypertensive chronic kidney disease with stage 1 through stage 4 chronic kidney disease, or unspecified chronic kidney disease: Secondary | ICD-10-CM

## 2019-04-06 DIAGNOSIS — E6609 Other obesity due to excess calories: Secondary | ICD-10-CM

## 2019-04-06 MED ORDER — METHOCARBAMOL 500 MG PO TABS: 500.0000 mg | ORAL_TABLET | Freq: Three times a day (TID) | ORAL | 0 refills | Status: DC

## 2019-04-06 MED ORDER — KETOROLAC TROMETHAMINE 30 MG/ML IJ SOLN
30.0000 mg | Freq: Once | INTRAMUSCULAR | Status: AC
  Administered 2019-04-06: 30 mg via INTRAMUSCULAR

## 2019-04-06 NOTE — Patient Instructions (Addendum)
Calm, magnesium supplement - one teaspoon nightly   Sciatica  Sciatica is pain, weakness, tingling, or loss of feeling (numbness) along the sciatic nerve. The sciatic nerve starts in the lower back and goes down the back of each leg. Sciatica usually goes away on its own or with treatment. Sometimes, sciatica may come back (recur). What are the causes? This condition happens when the sciatic nerve is pinched or has pressure put on it. This may be the result of:  A disk in between the bones of the spine bulging out too far (herniated disk).  Changes in the spinal disks that occur with aging.  A condition that affects a muscle in the butt.  Extra bone growth near the sciatic nerve.  A break (fracture) of the area between your hip bones (pelvis).  Pregnancy.  Tumor. This is rare. What increases the risk? You are more likely to develop this condition if you:  Play sports that put pressure or stress on the spine.  Have poor strength and ease of movement (flexibility).  Have had a back injury in the past.  Have had back surgery.  Sit for long periods of time.  Do activities that involve bending or lifting over and over again.  Are very overweight (obese). What are the signs or symptoms? Symptoms can vary from mild to very bad. They may include:  Any of these problems in the lower back, leg, hip, or butt: ? Mild tingling, loss of feeling, or dull aches. ? Burning sensations. ? Sharp pains.  Loss of feeling in the back of the calf or the sole of the foot.  Leg weakness.  Very bad back pain that makes it hard to move. These symptoms may get worse when you cough, sneeze, or laugh. They may also get worse when you sit or stand for long periods of time. How is this treated? This condition often gets better without any treatment. However, treatment may include:  Changing or cutting back on physical activity when you have pain.  Doing exercises and stretching.  Putting  ice or heat on the affected area.  Medicines that help: ? To relieve pain and swelling. ? To relax your muscles.  Shots (injections) of medicines that help to relieve pain, irritation, and swelling.  Surgery. Follow these instructions at home: Medicines  Take over-the-counter and prescription medicines only as told by your doctor.  Ask your doctor if the medicine prescribed to you: ? Requires you to avoid driving or using heavy machinery. ? Can cause trouble pooping (constipation). You may need to take these steps to prevent or treat trouble pooping:  Drink enough fluids to keep your pee (urine) pale yellow.  Take over-the-counter or prescription medicines.  Eat foods that are high in fiber. These include beans, whole grains, and fresh fruits and vegetables.  Limit foods that are high in fat and sugar. These include fried or sweet foods. Managing pain      If told, put ice on the affected area. ? Put ice in a plastic bag. ? Place a towel between your skin and the bag. ? Leave the ice on for 20 minutes, 2-3 times a day.  If told, put heat on the affected area. Use the heat source that your doctor tells you to use, such as a moist heat pack or a heating pad. ? Place a towel between your skin and the heat source. ? Leave the heat on for 20-30 minutes. ? Remove the heat if your skin turns  bright red. This is very important if you are unable to feel pain, heat, or cold. You may have a greater risk of getting burned. Activity   Return to your normal activities as told by your doctor. Ask your doctor what activities are safe for you.  Avoid activities that make your symptoms worse.  Take short rests during the day. ? When you rest for a long time, do some physical activity or stretching between periods of rest. ? Avoid sitting for a long time without moving. Get up and move around at least one time each hour.  Exercise and stretch regularly, as told by your doctor.  Do  not lift anything that is heavier than 10 lb (4.5 kg) while you have symptoms of sciatica. ? Avoid lifting heavy things even when you do not have symptoms. ? Avoid lifting heavy things over and over.  When you lift objects, always lift in a way that is safe for your body. To do this, you should: ? Bend your knees. ? Keep the object close to your body. ? Avoid twisting. General instructions  Stay at a healthy weight.  Wear comfortable shoes that support your feet. Avoid wearing high heels.  Avoid sleeping on a mattress that is too soft or too hard. You might have less pain if you sleep on a mattress that is firm enough to support your back.  Keep all follow-up visits as told by your doctor. This is important. Contact a doctor if:  You have pain that: ? Wakes you up when you are sleeping. ? Gets worse when you lie down. ? Is worse than the pain you have had in the past. ? Lasts longer than 4 weeks.  You lose weight without trying. Get help right away if:  You cannot control when you pee (urinate) or poop (have a bowel movement).  You have weakness in any of these areas and it gets worse: ? Lower back. ? The area between your hip bones. ? Butt. ? Legs.  You have redness or swelling of your back.  You have a burning feeling when you pee. Summary  Sciatica is pain, weakness, tingling, or loss of feeling (numbness) along the sciatic nerve.  This condition happens when the sciatic nerve is pinched or has pressure put on it.  Sciatica can cause pain, tingling, or loss of feeling (numbness) in the lower back, legs, hips, and butt.  Treatment often includes rest, exercise, medicines, and putting ice or heat on the affected area. This information is not intended to replace advice given to you by your health care provider. Make sure you discuss any questions you have with your health care provider. Document Revised: 03/10/2018 Document Reviewed: 03/10/2018 Elsevier Patient  Education  [place].   Sciatica Rehab Ask your health care provider which exercises are safe for you. Do exercises exactly as told by your health care provider and adjust them as directed. It is normal to feel mild stretching, pulling, tightness, or discomfort as you do these exercises. Stop right away if you feel sudden pain or your pain gets worse. Do not begin these exercises until told by your health care provider. Stretching and range-of-motion exercises These exercises warm up your muscles and joints and improve the movement and flexibility of your hips and back. These exercises also help to relieve pain, numbness, and tingling. Sciatic nerve glide Sit in a chair with your head facing down toward your chest. Place your hands behind your back. Let your  shoulders slump forward. Slowly straighten one of your legs while you tilt your head back as if you are looking toward the ceiling. Only straighten your leg as far as you can without making your symptoms worse. Hold this position for __________ seconds. Slowly return to the starting position. Repeat with your other leg. Repeat __________ times. Complete this exercise __________ times a day. Knee to chest with hip adduction and internal rotation  Lie on your back on a firm surface with both legs straight. Bend one of your knees and move it up toward your chest until you feel a gentle stretch in your lower back and buttock. Then, move your knee toward the shoulder that is on the opposite side from your leg. This is hip adduction and internal rotation. Hold your leg in this position by holding on to the front of your knee. Hold this position for __________ seconds. Slowly return to the starting position. Repeat with your other leg. Repeat __________ times. Complete this exercise __________ times a day. Prone extension on elbows  Lie on your abdomen on a firm surface. A bed may be too soft for this exercise. Prop yourself up on  your elbows. Use your arms to help lift your chest up until you feel a gentle stretch in your abdomen and your lower back. This will place some of your body weight on your elbows. If this is uncomfortable, try stacking pillows under your chest. Your hips should stay down, against the surface that you are lying on. Keep your hip and back muscles relaxed. Hold this position for __________ seconds. Slowly relax your upper body and return to the starting position. Repeat __________ times. Complete this exercise __________ times a day. Strengthening exercises These exercises build strength and endurance in your back. Endurance is the ability to use your muscles for a long time, even after they get tired. Pelvic tilt This exercise strengthens the muscles that lie deep in the abdomen. Lie on your back on a firm surface. Bend your knees and keep your feet flat on the floor. Tense your abdominal muscles. Tip your pelvis up toward the ceiling and flatten your lower back into the floor. To help with this exercise, you may place a small towel under your lower back and try to push your back into the towel. Hold this position for __________ seconds. Let your muscles relax completely before you repeat this exercise. Repeat __________ times. Complete this exercise __________ times a day. Alternating arm and leg raises  Get on your hands and knees on a firm surface. If you are on a hard floor, you may want to use padding, such as an exercise mat, to cushion your knees. Line up your arms and legs. Your hands should be directly below your shoulders, and your knees should be directly below your hips. Lift your left leg behind you. At the same time, raise your right arm and straighten it in front of you. Do not lift your leg higher than your hip. Do not lift your arm higher than your shoulder. Keep your abdominal and back muscles tight. Keep your hips facing the ground. Do not arch your back. Keep your balance  carefully, and do not hold your breath. Hold this position for __________ seconds. Slowly return to the starting position. Repeat with your right leg and your left arm. Repeat __________ times. Complete this exercise __________ times a day. Posture and body mechanics Good posture and healthy body mechanics can help to relieve stress in your  body's tissues and joints. Body mechanics refers to the movements and positions of your body while you do your daily activities. Posture is part of body mechanics. Good posture means: Your spine is in its natural S-curve position (neutral). Your shoulders are pulled back slightly. Your head is not tipped forward. Follow these guidelines to improve your posture and body mechanics in your everyday activities. Standing  When standing, keep your spine neutral and your feet about hip width apart. Keep a slight bend in your knees. Your ears, shoulders, and hips should line up. When you do a task in which you stand in one place for a long time, place one foot up on a stable object that is 2-4 inches (5-10 cm) high, such as a footstool. This helps keep your spine neutral. Sitting  When sitting, keep your spine neutral and keep your feet flat on the floor. Use a footrest, if necessary, and keep your thighs parallel to the floor. Avoid rounding your shoulders, and avoid tilting your head forward. When working at a desk or a computer, keep your desk at a height where your hands are slightly lower than your elbows. Slide your chair under your desk so you are close enough to maintain good posture. When working at a computer, place your monitor at a height where you are looking straight ahead and you do not have to tilt your head forward or downward to look at the screen. Resting When lying down and resting, avoid positions that are most painful for you. If you have pain with activities such as sitting, bending, stooping, or squatting, lie in a position in which your body  does not bend very much. For example, avoid curling up on your side with your arms and knees near your chest (fetal position). If you have pain with activities such as standing for a long time or reaching with your arms, lie with your spine in a neutral position and bend your knees slightly. Try the following positions: Lying on your side with a pillow between your knees. Lying on your back with a pillow under your knees. Lifting  When lifting objects, keep your feet at least shoulder width apart and tighten your abdominal muscles. Bend your knees and hips and keep your spine neutral. It is important to lift using the strength of your legs, not your back. Do not lock your knees straight out. Always ask for help to lift heavy or awkward objects. This information is not intended to replace advice given to you by your health care provider. Make sure you discuss any questions you have with your health care provider. Document Revised: 06/13/2018 Document Reviewed: 03/13/2018 Elsevier Patient Education  [place].

## 2019-04-11 NOTE — Progress Notes (Signed)
This visit occurred during the SARS-CoV-2 public health emergency.  Safety protocols were in place, including screening questions prior to the visit, additional usage of staff PPE, and extensive cleaning of exam room while observing appropriate contact time as indicated for disinfecting solutions.  Subjective:     Patient ID: Amy Serrano , female    DOB: March 31, 1945 , 74 y.o.   MRN: [id number]   Chief Complaint  Patient presents with  . Radiating pain from back down right leg    HPI  Back Pain This is a recurrent problem. The current episode started 1 to 4 weeks ago. The problem occurs constantly. The problem is unchanged. The pain is present in the gluteal. The quality of the pain is described as aching. The pain radiates to the right thigh. The pain is at a severity of 7/10. The pain is moderate. The pain is worse during the night. The symptoms are aggravated by standing. Stiffness is present in the morning. Pertinent negatives include no abdominal pain, bladder incontinence, bowel incontinence, numbness, paresthesias or tingling. Risk factors include lack of exercise. She has tried NSAIDs for the symptoms. The treatment provided mild relief.     Past Medical History:  Diagnosis Date  . Endometriosis   . Hyperlipidemia   . Hypertension      Family History  Problem Relation Age of Onset  . Hypertension Other   . Stroke Other   . Hypertension Mother   . Heart attack Father      Current Outpatient Medications:  .  amLODipine (NORVASC) 10 MG tablet, TAKE 1 TABLET BY MOUTH DAILY, Disp: 90 tablet, Rfl: 1 .  aspirin 81 MG tablet, Take 81 mg by mouth at bedtime. , Disp: , Rfl:  .  hydrochlorothiazide (HYDRODIURIL) 12.5 MG tablet, Take 12.5 mg by mouth 3 (three) times a week., Disp: , Rfl: 2 .  Multiple Vitamin (MULTIVITAMIN) tablet, Take 1 tablet by mouth daily., Disp: , Rfl:  .  methocarbamol (ROBAXIN) 500 MG tablet, Take 1 tablet (500 mg total) by mouth 3 (three) times daily.,  Disp: 30 tablet, Rfl: 0   Allergies  Allergen Reactions  . Benazepril Swelling    Swelling of the face   . Ace Inhibitors     Angioedema     Review of Systems  Constitutional: Negative.   Respiratory: Negative.   Cardiovascular: Negative.   Gastrointestinal: Negative.  Negative for abdominal pain and bowel incontinence.  Genitourinary: Negative for bladder incontinence.  Musculoskeletal: Positive for back pain.  Neurological: Negative.  Negative for tingling, numbness and paresthesias.  Psychiatric/Behavioral: Negative.      Today's Vitals   04/06/19 1409  BP: (!) 156/92  Pulse: 69  Temp: 98.3 F (36.8 C)  TempSrc: Oral  Weight: 208 lb 0.3 oz (94.4 kg)  Height: 5\' 6"  (1.676 m)  PainSc: 4   PainLoc: Back   Body mass index is 33.58 kg/m.   Objective:  Physical Exam Vitals and nursing note reviewed.  Constitutional:      Appearance: Normal appearance.  HENT:     Head: Normocephalic and atraumatic.  Cardiovascular:     Rate and Rhythm: Normal rate and regular rhythm.     Heart sounds: Normal heart sounds.  Pulmonary:     Effort: Pulmonary effort is normal.     Breath sounds: Normal breath sounds.  Musculoskeletal:     Lumbar back: Spasms and tenderness present. No bony tenderness. Negative right straight leg raise test and negative left straight leg raise  test.     Comments: Neg straight leg test  Skin:    General: Skin is warm.  Neurological:     General: No focal deficit present.     Mental Status: She is alert.  Psychiatric:        Mood and Affect: Mood normal.        Behavior: Behavior normal.         Assessment And Plan:     1. Acute right-sided low back pain with right-sided sciatica  She was given Toradol, 30mg  IM x 1 and rx Robaxin twice daily as needed. She was given stretching exercises to perform. She is encouraged to contact me in 24-72 hours to let me know how she is doing.   - ketorolac (TORADOL) 30 MG/ML injection 30 mg  2.  Hypertensive nephropathy  Chronic, uncontrolled. Likely exacerbated by her discomfort.  She will continue with current meds for now. She is encouraged to avoid adding salt to her foods and to avoid packaged foods which tend to be high in sodium.   3. Chronic renal disease, stage II  Chronic. She is encouraged to stay well hydrated.   4. Class 1 obesity due to excess calories with serious comorbidity and body mass index (BMI) of 33.0 to 33.9 in adult  She is encouraged to strive for BMI less than 30 to decrease cardiac risk. She is encouraged to gradually increase her daily activity as tolerated.   Maximino Greenland, MD    THE PATIENT IS ENCOURAGED TO PRACTICE SOCIAL DISTANCING DUE TO THE COVID-19 PANDEMIC.

## 2019-04-20 ENCOUNTER — Ambulatory Visit: Payer: Medicare PPO | Attending: Internal Medicine

## 2019-04-20 DIAGNOSIS — Z23 Encounter for immunization: Secondary | ICD-10-CM | POA: Insufficient documentation

## 2019-04-20 NOTE — Progress Notes (Signed)
   Covid-19 Vaccination Clinic  Name:  Amy Serrano    MRN: [id number] DOB: 1946-02-11  04/20/2019  [name] was observed post Covid-19 immunization for 30 minutes based on pre-vaccination screening without incidence. She was provided with Vaccine Information Sheet and instruction to access the V-Safe system.   [name] was instructed to call 911 with any severe reactions post vaccine: Marland Kitchen Difficulty breathing  . Swelling of your face and throat  . A fast heartbeat  . A bad rash all over your body  . Dizziness and weakness    Immunizations Administered    Name Date Dose VIS Date Route   Pfizer COVID-19 Vaccine 04/20/2019  2:36 PM 0.3 mL 02/13/2019 Intramuscular   Manufacturer: [place]   Lot: [id number]   [place]: [id number]

## 2019-05-12 ENCOUNTER — Ambulatory Visit: Payer: Medicare PPO | Attending: Internal Medicine

## 2019-05-12 DIAGNOSIS — Z23 Encounter for immunization: Secondary | ICD-10-CM | POA: Insufficient documentation

## 2019-05-12 NOTE — Progress Notes (Signed)
   Covid-19 Vaccination Clinic  Name:  Amy Serrano    MRN: [id number] DOB: 1945-04-02  05/12/2019  Amy Serrano was observed post Covid-19 immunization for 15 minutes without incident. She was provided with Vaccine Information Sheet and instruction to access the V-Safe system.   Amy Serrano was instructed to call 911 with any severe reactions post vaccine: Marland Kitchen Difficulty breathing  . Swelling of face and throat  . A fast heartbeat  . A bad rash all over body  . Dizziness and weakness   Immunizations Administered    Name Date Dose VIS Date Route   Pfizer COVID-19 Vaccine 05/12/2019 10:54 AM 0.3 mL 02/13/2019 Intramuscular   Manufacturer: [place]   Lot: [id number]   [place]: [id number]

## 2019-05-13 ENCOUNTER — Ambulatory Visit: Payer: Medicare PPO

## 2019-06-04 DIAGNOSIS — H2513 Age-related nuclear cataract, bilateral: Secondary | ICD-10-CM | POA: Diagnosis not present

## 2019-06-04 DIAGNOSIS — H40033 Anatomical narrow angle, bilateral: Secondary | ICD-10-CM | POA: Diagnosis not present

## 2019-07-29 ENCOUNTER — Other Ambulatory Visit: Payer: Self-pay

## 2019-07-29 ENCOUNTER — Ambulatory Visit: Payer: Medicare PPO | Admitting: Internal Medicine

## 2019-07-29 ENCOUNTER — Encounter: Payer: Self-pay | Admitting: Internal Medicine

## 2019-07-29 VITALS — BP 132/86 | HR 63 | Temp 98.1°F | Ht 64.4 in | Wt 208.0 lb

## 2019-07-29 DIAGNOSIS — E7800 Pure hypercholesterolemia, unspecified: Secondary | ICD-10-CM | POA: Diagnosis not present

## 2019-07-29 DIAGNOSIS — Z6835 Body mass index (BMI) 35.0-35.9, adult: Secondary | ICD-10-CM

## 2019-07-29 DIAGNOSIS — I129 Hypertensive chronic kidney disease with stage 1 through stage 4 chronic kidney disease, or unspecified chronic kidney disease: Secondary | ICD-10-CM

## 2019-07-29 DIAGNOSIS — R7309 Other abnormal glucose: Secondary | ICD-10-CM

## 2019-07-29 DIAGNOSIS — N182 Chronic kidney disease, stage 2 (mild): Secondary | ICD-10-CM | POA: Diagnosis not present

## 2019-07-29 DIAGNOSIS — E66812 Obesity, class 2: Secondary | ICD-10-CM

## 2019-07-29 MED ORDER — HYDROCHLOROTHIAZIDE 12.5 MG PO TABS: 12.5000 mg | ORAL_TABLET | ORAL | 2 refills | Status: DC

## 2019-07-29 NOTE — Patient Instructions (Signed)

## 2019-07-29 NOTE — Progress Notes (Signed)
This visit occurred during the SARS-CoV-2 public health emergency.  Safety protocols were in place, including screening questions prior to the visit, additional usage of staff PPE, and extensive cleaning of exam room while observing appropriate contact time as indicated for disinfecting solutions.  Subjective:     Patient ID: Amy Serrano , female    DOB: 03-Jan-1946 , 74 y.o.   MRN: [id number]   Chief Complaint  Patient presents with  . Hypertension    HPI  She is here today for a bp check.  She reports compliance with meds. Recently went to beach with her son's family.   Hypertension This is a chronic problem. The current episode started more than 1 year ago. The problem has been gradually improving since onset. The problem is controlled. Pertinent negatives include no blurred vision, chest pain, palpitations or shortness of breath. Risk factors for coronary artery disease include obesity, post-menopausal state and sedentary lifestyle. The current treatment provides moderate improvement. Hypertensive end-organ damage includes kidney disease.     Past Medical History:  Diagnosis Date  . Endometriosis   . Hyperlipidemia   . Hypertension      Family History  Problem Relation Age of Onset  . Hypertension Other   . Stroke Other   . Hypertension Mother   . Heart attack Father      Current Outpatient Medications:  .  amLODipine (NORVASC) 10 MG tablet, TAKE 1 TABLET BY MOUTH DAILY, Disp: 90 tablet, Rfl: 1 .  aspirin 81 MG tablet, Take 81 mg by mouth at bedtime. , Disp: , Rfl:  .  hydrochlorothiazide (HYDRODIURIL) 12.5 MG tablet, Take 1 tablet (12.5 mg total) by mouth 3 (three) times a week., Disp: 30 tablet, Rfl: 2 .  methocarbamol (ROBAXIN) 500 MG tablet, Take 1 tablet (500 mg total) by mouth 3 (three) times daily., Disp: 30 tablet, Rfl: 0 .  Multiple Vitamin (MULTIVITAMIN) tablet, Take 1 tablet by mouth daily., Disp: , Rfl:    Allergies  Allergen Reactions  . Benazepril  Swelling    Swelling of the face   . Ace Inhibitors     Angioedema     Review of Systems  Constitutional: Negative.   Eyes: Negative for blurred vision.  Respiratory: Negative.  Negative for shortness of breath.   Cardiovascular: Negative.  Negative for chest pain and palpitations.  Gastrointestinal: Negative.   Neurological: Negative.   Psychiatric/Behavioral: Negative.      Today's Vitals   07/29/19 1115  BP: 132/86  Pulse: 63  Temp: 98.1 F (36.7 C)  Weight: 208 lb (94.3 kg)  Height: 5' 4.4" (1.636 m)   Body mass index is 35.26 kg/m.   Objective:  Physical Exam Vitals and nursing note reviewed.  Constitutional:      Appearance: Normal appearance.  HENT:     Head: Normocephalic and atraumatic.  Cardiovascular:     Rate and Rhythm: Normal rate and regular rhythm.     Heart sounds: Normal heart sounds.  Pulmonary:     Effort: Pulmonary effort is normal.     Breath sounds: Normal breath sounds.  Skin:    General: Skin is warm.  Neurological:     General: No focal deficit present.     Mental Status: She is alert.  Psychiatric:        Mood and Affect: Mood normal.        Behavior: Behavior normal.         Assessment And Plan:     1. Hypertensive  nephropathy  Chronic, fair control.  She is advised BP goal is less than 130/80. She is encouraged to avoid adding salt to her foods. She will continue with current meds for now.  I will check renal function today.          - CMP14+EGFR  2. Chronic renal disease, stage II  Chronic, yet stable. I will check renal function today. She is encouraged to stay well hydrated and to keep BP well controlled.   3. Other abnormal glucose  HER A1C HAS BEEN ELEVATED IN THE PAST. I WILL CHECK AN A1C, BMET TODAY. SHE WAS ENCOURAGED TO AVOID SUGARY BEVERAGES AND PROCESSED FOODS INCLUDNG BREADS, RICE AND PASTA.  - Hemoglobin A1c  4. Pure hypercholesterolemia  Chronic, I will check a fasting lipid panel and LFTs.    -  Lipid panel  5. Class 2 severe obesity due to excess calories with serious comorbidity and body mass index (BMI) of 35.0 to 35.9 in adult [facility])  She is encouraged to strive for BMI less than 30 to decrease cardiac risk. Importance of regular exercise was discussed with the patient. She is encouraged to strive for BMI less than 30 to decrease cardiac risk.   Maximino Greenland, MD    THE PATIENT IS ENCOURAGED TO PRACTICE SOCIAL DISTANCING DUE TO THE COVID-19 PANDEMIC.

## 2019-07-30 LAB — HEMOGLOBIN A1C
Est. average glucose Bld gHb Est-mCnc: 128 mg/dL
Hgb A1c MFr Bld: 6.1 % — ABNORMAL HIGH (ref 4.8–5.6)

## 2019-07-30 LAB — LIPID PANEL
Chol/HDL Ratio: 4.4 ratio (ref 0.0–4.4)
Cholesterol, Total: 240 mg/dL — ABNORMAL HIGH (ref 100–199)
HDL: 54 mg/dL (ref >39)
LDL Chol Calc (NIH): 151 mg/dL — ABNORMAL HIGH (ref 0–99)
Triglycerides: 192 mg/dL — ABNORMAL HIGH (ref 0–149)
VLDL Cholesterol Cal: 35 mg/dL (ref 5–40)

## 2019-07-30 LAB — CMP14+EGFR
ALT: 14 IU/L (ref 0–32)
AST: 21 IU/L (ref 0–40)
Albumin/Globulin Ratio: 1.6 (ref 1.2–2.2)
Albumin: 4.4 g/dL (ref 3.7–4.7)
Alkaline Phosphatase: 87 IU/L (ref 48–121)
BUN/Creatinine Ratio: 20 (ref 12–28)
BUN: 20 mg/dL (ref 8–27)
Bilirubin Total: 0.3 mg/dL (ref 0.0–1.2)
CO2: 22 mmol/L (ref 20–29)
Calcium: 9.4 mg/dL (ref 8.7–10.3)
Chloride: 102 mmol/L (ref 96–106)
Creatinine, Ser: 1 mg/dL (ref 0.57–1.00)
GFR calc Af Amer: 64 mL/min/{1.73_m2} (ref >59)
GFR calc non Af Amer: 56 mL/min/{1.73_m2} — ABNORMAL LOW (ref >59)
Globulin, Total: 2.8 g/dL (ref 1.5–4.5)
Glucose: 91 mg/dL (ref 65–99)
Potassium: 4.2 mmol/L (ref 3.5–5.2)
Sodium: 139 mmol/L (ref 134–144)
Total Protein: 7.2 g/dL (ref 6.0–8.5)

## 2019-08-04 ENCOUNTER — Other Ambulatory Visit: Payer: Self-pay | Admitting: Internal Medicine

## 2019-08-04 ENCOUNTER — Other Ambulatory Visit: Payer: Self-pay

## 2019-08-04 MED ORDER — PRAVASTATIN SODIUM 40 MG PO TABS: 40.0000 mg | ORAL_TABLET | Freq: Every evening | ORAL | 1 refills | Status: DC

## 2019-08-18 ENCOUNTER — Telehealth: Payer: Self-pay

## 2019-08-18 ENCOUNTER — Other Ambulatory Visit: Payer: Self-pay

## 2019-08-18 ENCOUNTER — Other Ambulatory Visit: Payer: Self-pay | Admitting: Internal Medicine

## 2019-08-18 ENCOUNTER — Encounter: Payer: Self-pay | Admitting: Internal Medicine

## 2019-08-18 ENCOUNTER — Ambulatory Visit (INDEPENDENT_AMBULATORY_CARE_PROVIDER_SITE_OTHER): Payer: Medicare PPO | Admitting: Internal Medicine

## 2019-08-18 VITALS — BP 146/84 | HR 71 | Temp 98.0°F | Ht 64.4 in | Wt 207.0 lb

## 2019-08-18 DIAGNOSIS — L509 Urticaria, unspecified: Secondary | ICD-10-CM

## 2019-08-18 DIAGNOSIS — Z7982 Long term (current) use of aspirin: Secondary | ICD-10-CM

## 2019-08-18 DIAGNOSIS — N182 Chronic kidney disease, stage 2 (mild): Secondary | ICD-10-CM

## 2019-08-18 DIAGNOSIS — I129 Hypertensive chronic kidney disease with stage 1 through stage 4 chronic kidney disease, or unspecified chronic kidney disease: Secondary | ICD-10-CM

## 2019-08-18 DIAGNOSIS — T7840XA Allergy, unspecified, initial encounter: Secondary | ICD-10-CM

## 2019-08-18 MED ORDER — TRIAMCINOLONE ACETONIDE 40 MG/ML IJ SUSP
60.0000 mg | Freq: Once | INTRAMUSCULAR | Status: AC
  Administered 2019-08-18: 60 mg via INTRAMUSCULAR

## 2019-08-18 MED ORDER — HYDROXYZINE HCL 25 MG PO TABS: 25.0000 mg | ORAL_TABLET | Freq: Three times a day (TID) | ORAL | 0 refills | Status: DC | PRN

## 2019-08-18 NOTE — Telephone Encounter (Signed)
The pt called left a message that the swelling in her face has gone down she isn't itching as bad, the rash is still there but it seems to be getting better.

## 2019-08-18 NOTE — Progress Notes (Addendum)
This visit occurred during the SARS-CoV-2 public health emergency.  Safety protocols were in place, including screening questions prior to the visit, additional usage of staff PPE, and extensive cleaning of exam room while observing appropriate contact time as indicated for disinfecting solutions.  Subjective:     Patient ID: Amy Serrano , female    DOB: 04-07-1945 , 74 y.o.   MRN: [id number]   Chief Complaint  Patient presents with  . Facial Swelling    HPI  She presents today for further evaluation of facial swelling. She reports her sx started on Saturday, June 12th. Saturday evening she noticed that her entire face was swollen. She denies eating anything new on Friday or Saturday. She put an ice pack on her face with some relief. She did not take any meds. Sunday morning, she experienced facial itching and swelling - so she took Benadryl and applied ice pack again. She also applied cortisone cream to her face, this helped with the sx     Past Medical History:  Diagnosis Date  . Endometriosis   . Hyperlipidemia   . Hypertension      Family History  Problem Relation Age of Onset  . Hypertension Other   . Stroke Other   . Hypertension Mother   . Heart attack Father      Current Outpatient Medications:  .  amLODipine (NORVASC) 10 MG tablet, TAKE 1 TABLET BY MOUTH DAILY, Disp: 90 tablet, Rfl: 1 .  aspirin 81 MG tablet, Take 81 mg by mouth at bedtime. , Disp: , Rfl:  .  diphenhydrAMINE HCl (BENADRYL PO), Take by mouth., Disp: , Rfl:  .  hydrochlorothiazide (HYDRODIURIL) 12.5 MG tablet, Take 1 tablet (12.5 mg total) by mouth 3 (three) times a week., Disp: 30 tablet, Rfl: 2 .  hydrocortisone cream 1 %, Apply 1 application topically 2 (two) times daily., Disp: , Rfl:  .  Multiple Vitamin (MULTIVITAMIN) tablet, Take 1 tablet by mouth daily., Disp: , Rfl:  .  pravastatin (PRAVACHOL) 40 MG tablet, TAKE 1 TABLET(40 MG) BY MOUTH EVERY EVENING, Disp: 90 tablet, Rfl: 1 .   hydrOXYzine (ATARAX/VISTARIL) 25 MG tablet, Take 1 tablet (25 mg total) by mouth 3 (three) times daily as needed., Disp: 30 tablet, Rfl: 0   Allergies  Allergen Reactions  . Benazepril Swelling    Swelling of the face   . Ace Inhibitors     Angioedema     Review of Systems  Constitutional: Negative.   Respiratory: Negative.  Negative for cough, shortness of breath, wheezing and stridor.   Cardiovascular: Negative.   Gastrointestinal: Negative.   Skin: Positive for rash.  Neurological: Negative.   Psychiatric/Behavioral: Negative.      Today's Vitals   08/18/19 1103  BP: (!) 146/84  Pulse: 71  Temp: 98 F (36.7 C)  TempSrc: Oral  Weight: 207 lb (93.9 kg)  Height: 5' 4.4" (1.636 m)  PainSc: 0-No pain   Body mass index is 35.09 kg/m.   Objective:  Physical Exam Vitals and nursing note reviewed.  Constitutional:      Appearance: Normal appearance.  HENT:     Head: Normocephalic and atraumatic.  Cardiovascular:     Rate and Rhythm: Normal rate and regular rhythm.     Heart sounds: Normal heart sounds.  Pulmonary:     Effort: Pulmonary effort is normal.     Breath sounds: Normal breath sounds.  Skin:    General: Skin is warm.     Comments: Scattered  flesh-colored papular lesions on face, there is some erythema. No vesicular lesions noted.   Neurological:     General: No focal deficit present.     Mental Status: She is alert.  Psychiatric:        Mood and Affect: Mood normal.        Behavior: Behavior normal.         Assessment And Plan:     1. Urticaria  I will check food allergen panel. However, I do think it is possible she got into some poison ivy while gardening. She was also given Kenalog, 60mg  IM x 1 and rx atarax to use prn. If persistent, I will start her on cetirizine 10mg  nightly.   - triamcinolone acetonide (KENALOG-40) injection 60 mg - Allergens(46)Foods  2. Allergic reaction, initial encounter  Please see #1. She was advised to wear  gloves when gardening and to avoid touching her face while outside.   - triamcinolone acetonide (KENALOG-40) injection 60 mg - Allergens(46)Foods    Maximino Greenland, MD    THE PATIENT IS ENCOURAGED TO PRACTICE SOCIAL DISTANCING DUE TO THE COVID-19 PANDEMIC.

## 2019-08-18 NOTE — Patient Instructions (Signed)
Contact Dermatitis Dermatitis is redness, soreness, and swelling (inflammation) of the skin. Contact dermatitis is a reaction to certain substances that touch the skin. Many different substances can cause contact dermatitis. There are two types of contact dermatitis:  Irritant contact dermatitis. This type is caused by something that irritates your skin, such as having dry hands from washing them too often with soap. This type does not require previous exposure to the substance for a reaction to occur. This is the most common type.  Allergic contact dermatitis. This type is caused by a substance that you are allergic to, such as poison ivy. This type occurs when you have been exposed to the substance (allergen) and develop a sensitivity to it. Dermatitis may develop soon after your first exposure to the allergen, or it may not develop until the next time you are exposed and every time thereafter. What are the causes? Irritant contact dermatitis is most commonly caused by exposure to:  Makeup.  Soaps.  Detergents.  Bleaches.  Acids.  Metal salts, such as nickel. Allergic contact dermatitis is most commonly caused by exposure to:  Poisonous plants.  Chemicals.  Jewelry.  Latex.  Medicines.  Preservatives in products, such as clothing. What increases the risk? You are more likely to develop this condition if you have:  A job that exposes you to irritants or allergens.  Certain medical conditions, such as asthma or eczema. What are the signs or symptoms? Symptoms of this condition may occur on your body anywhere the irritant has touched you or is touched by you.  Symptoms include: ? Dryness or flaking. ? Redness. ? Cracks. ? Itching. ? Pain or a burning feeling. ? Blisters. ? Drainage of small amounts of blood or clear fluid from skin cracks. With allergic contact dermatitis, there may also be swelling in areas such as the eyelids, mouth, or genitals. How is this  diagnosed? This condition is diagnosed with a medical history and physical exam.  A patch skin test may be performed to help determine the cause.  If the condition is related to your job, you may need to see an occupational medicine specialist. How is this treated? This condition is treated by checking for the cause of the reaction and protecting your skin from further contact. Treatment may also include:  Steroid creams or ointments. Oral steroid medicines may be needed in more severe cases.  Antibiotic medicines or antibacterial ointments, if a skin infection is present.  Antihistamine lotion or an antihistamine taken by mouth to ease itching.  A bandage (dressing). Follow these instructions at home: Skin care  Moisturize your skin as needed.  Apply cool compresses to the affected areas.  Try applying baking soda paste to your skin. Stir water into baking soda until it reaches a paste-like consistency.  Do not scratch your skin, and avoid friction to the affected area.  Avoid the use of soaps, perfumes, and dyes. Medicines  Take or apply over-the-counter and prescription medicines only as told by your health care provider.  If you were prescribed an antibiotic medicine, take or apply the antibiotic as told by your health care provider. Do not stop using the antibiotic even if your condition improves. Bathing  Try taking a bath with: ? Epsom salts. Follow the instructions on the packaging. You can get these at your local pharmacy or grocery store. ? Baking soda. Pour a small amount into the bath as directed by your health care provider. ? Colloidal oatmeal. Follow the instructions on the   packaging. You can get this at your local pharmacy or grocery store.  Bathe less frequently, such as every other day.  Bathe in lukewarm water. Avoid using hot water. Bandage care  If you were given a bandage (dressing), change it as told by your health care provider.  Wash your hands  with soap and water before and after you change your dressing. If soap and water are not available, use hand sanitizer. General instructions  Avoid the substance that caused your reaction. If you do not know what caused it, keep a journal to try to track what caused it. Write down: ? What you eat. ? What cosmetic products you use. ? What you drink. ? What you wear in the affected area. This includes jewelry.  Check the affected areas every day for signs of infection. Check for: ? More redness, swelling, or pain. ? More fluid or blood. ? Warmth. ? Pus or a bad smell.  Keep all follow-up visits as told by your health care provider. This is important. Contact a health care provider if:  Your condition does not improve with treatment.  Your condition gets worse.  You have signs of infection such as swelling, tenderness, redness, soreness, or warmth in the affected area.  You have a fever.  You have new symptoms. Get help right away if:  You have a severe headache, neck pain, or neck stiffness.  You vomit.  You feel very sleepy.  You notice red streaks coming from the affected area.  Your bone or joint underneath the affected area becomes painful after the skin has healed.  The affected area turns darker.  You have difficulty breathing. Summary  Dermatitis is redness, soreness, and swelling (inflammation) of the skin. Contact dermatitis is a reaction to certain substances that touch the skin.  Symptoms of this condition may occur on your body anywhere the irritant has touched you or is touched by you.  This condition is treated by figuring out what caused the reaction and protecting your skin from further contact. Treatment may also include medicines and skin care.  Avoid the substance that caused your reaction. If you do not know what caused it, keep a journal to try to track what caused it.  Contact a health care provider if your condition gets worse or you have signs  of infection such as swelling, tenderness, redness, soreness, or warmth in the affected area. This information is not intended to replace advice given to you by your health care provider. Make sure you discuss any questions you have with your health care provider. Document Revised: 06/11/2018 Document Reviewed: 09/04/2017 Elsevier Patient Education  2020 [employer]  

## 2019-08-24 LAB — ALLERGENS(46)FOODS
Allergen Apple, IgE: 0.18 kU/L — AB
Allergen Banana IgE: 0.2 kU/L — AB
Allergen Black Pepper IgE: 0.13 kU/L — AB
Allergen Broccoli: 0.18 kU/L — AB
Allergen Cabbage IgE: 0.24 kU/L — AB
Allergen Carrot IgE: 0.2 kU/L — AB
Allergen Corn, IgE: 0.21 kU/L — AB
Allergen Garlic IgE: 0.29 kU/L — AB
Allergen Grape IgE: 0.12 kU/L — AB
Allergen Green Bean IgE: 0.24 kU/L — AB
Allergen Lamb IgE: <0.1 kU/L
Allergen Melon IgE: 0.21 kU/L — AB
Allergen Oat IgE: 0.27 kU/L — AB
Allergen Onion IgE: 0.31 kU/L — AB
Allergen Potato, White IgE: 0.24 kU/L — AB
Allergen Rice IgE: 0.28 kU/L — AB
Allergen Strawberry IgE: 0.23 kU/L — AB
Allergen Tomato, IgE: 0.25 kU/L — AB
Allergen Turkey IgE: <0.1 kU/L
Beef IgE: 0.86 kU/L — AB
Chicken IgE: <0.1 kU/L
Chocolate/Cacao IgE: <0.1 kU/L
Coffee: <0.1 kU/L
Egg, Whole IgE: <0.1 kU/L
F020-IgE Almond: 0.2 kU/L — AB
F023-IgE Crab: 0.17 kU/L — AB
F042-IgE Haddock: <0.1 kU/L
F045-IgE Yeast: <0.1 kU/L
F082-IgE Cheese, Mold Type: <0.1 kU/L
F096-IgE Avocado: 0.23 kU/L — AB
F222-IgE Tea: <0.1 kU/L
F279-IgE Chili Pepper: 0.2 kU/L — AB
F290-IgE Oyster: <0.1 kU/L
Halibut IgE: <0.1 kU/L
Lemon: 0.3 kU/L — AB
Milk IgE: <0.1 kU/L
Mushroom IgE: <0.1 kU/L
Orange: 0.2 kU/L — AB
Peanut IgE: 0.27 kU/L — AB
Pork IgE: 0.25 kU/L — AB
Rye IgE: 0.22 kU/L — AB
Shrimp IgE: 0.16 kU/L — AB
Soybean IgE: 0.21 kU/L — AB
Tuna: <0.1 kU/L
Vanilla: <0.1 kU/L
Wheat IgE: 0.22 kU/L — AB

## 2019-09-15 ENCOUNTER — Other Ambulatory Visit: Payer: Self-pay

## 2019-09-15 ENCOUNTER — Encounter: Payer: Self-pay | Admitting: Internal Medicine

## 2019-09-15 ENCOUNTER — Ambulatory Visit: Payer: Medicare PPO | Admitting: Internal Medicine

## 2019-09-15 VITALS — BP 134/86 | HR 70 | Temp 98.3°F | Ht 64.4 in | Wt 205.6 lb

## 2019-09-15 DIAGNOSIS — E7800 Pure hypercholesterolemia, unspecified: Secondary | ICD-10-CM

## 2019-09-15 DIAGNOSIS — Z7982 Long term (current) use of aspirin: Secondary | ICD-10-CM | POA: Diagnosis not present

## 2019-09-15 DIAGNOSIS — E6609 Other obesity due to excess calories: Secondary | ICD-10-CM

## 2019-09-15 DIAGNOSIS — Z6834 Body mass index (BMI) 34.0-34.9, adult: Secondary | ICD-10-CM

## 2019-09-15 NOTE — Progress Notes (Signed)
This visit occurred during the SARS-CoV-2 public health emergency.  Safety protocols were in place, including screening questions prior to the visit, additional usage of staff PPE, and extensive cleaning of exam room while observing appropriate contact time as indicated for disinfecting solutions.  Subjective:     Patient ID: Amy Serrano , female    DOB: 06-29-1945 , 74 y.o.   MRN: [id number]   Chief Complaint  Patient presents with  . Hyperlipidemia    HPI  She presents today for chol check. She has been taking pravastatin without any issues. She is finally get back to her routine.     Past Medical History:  Diagnosis Date  . Endometriosis   . Hyperlipidemia   . Hypertension      Family History  Problem Relation Age of Onset  . Hypertension Other   . Stroke Other   . Hypertension Mother   . Heart attack Father      Current Outpatient Medications:  .  amLODipine (NORVASC) 10 MG tablet, TAKE 1 TABLET BY MOUTH DAILY, Disp: 90 tablet, Rfl: 1 .  aspirin 81 MG tablet, Take 81 mg by mouth at bedtime. , Disp: , Rfl:  .  Multiple Vitamin (MULTIVITAMIN) tablet, Take 1 tablet by mouth daily., Disp: , Rfl:  .  pravastatin (PRAVACHOL) 40 MG tablet, TAKE 1 TABLET(40 MG) BY MOUTH EVERY EVENING, Disp: 90 tablet, Rfl: 1   Allergies  Allergen Reactions  . Benazepril Swelling    Swelling of the face   . Ace Inhibitors     Angioedema     Review of Systems  Constitutional: Negative.   Respiratory: Negative.   Cardiovascular: Negative.   Gastrointestinal: Negative.   Neurological: Negative.   Psychiatric/Behavioral: Negative.      Today's Vitals   09/15/19 1410  BP: 134/86  Pulse: 70  Temp: 98.3 F (36.8 C)  TempSrc: Oral  Weight: 205 lb 9.6 oz (93.3 kg)  Height: 5' 4.4" (1.636 m)  PainSc: 0-No pain   Body mass index is 34.85 kg/m.   Wt Readings from Last 3 Encounters:  09/15/19 205 lb 9.6 oz (93.3 kg)  08/18/19 207 lb (93.9 kg)  07/29/19 208 lb (94.3 kg)     Objective:  Physical Exam Vitals and nursing note reviewed.  Constitutional:      Appearance: Normal appearance.  HENT:     Head: Normocephalic and atraumatic.  Cardiovascular:     Rate and Rhythm: Normal rate and regular rhythm.     Heart sounds: Normal heart sounds.  Pulmonary:     Effort: Pulmonary effort is normal.     Breath sounds: Normal breath sounds.  Skin:    General: Skin is warm.  Neurological:     General: No focal deficit present.     Mental Status: She is alert.  Psychiatric:        Mood and Affect: Mood normal.        Behavior: Behavior normal.         Assessment And Plan:  Maximino Greenland, MD    1. Pure hypercholesterolemia  SHE WAS ENCOURAGED TO EXERCISE FIVE DAYS WEEKLY FOR AT LEAST 30 MINUTES, AVOID FRIED FOODS, EAT 25-35 GRAMS OF FIBER, AND TO EAT FISH AT LEAST TWICE WEEKLY.  2. Class 1 obesity due to excess calories with serious comorbidity and body mass index (BMI) of 34.0 to 34.9 in adult  Patient congratulated on her 2 pound weight loss since her last visit. Encouraged to aim for at  least 150 minutes of exercise per week.    I, Maximino Greenland, MD, have reviewed all documentation for this visit. The documentation on 09/15/19 for the exam, diagnosis, procedures, and orders are all accurate and complete.  THE PATIENT IS ENCOURAGED TO PRACTICE SOCIAL DISTANCING DUE TO THE COVID-19 PANDEMIC.

## 2019-09-15 NOTE — Patient Instructions (Signed)

## 2019-09-16 ENCOUNTER — Encounter: Payer: Self-pay | Admitting: Internal Medicine

## 2019-09-16 LAB — HEPATIC FUNCTION PANEL
ALT: 14 IU/L (ref 0–32)
AST: 20 IU/L (ref 0–40)
Albumin: 4.1 g/dL (ref 3.7–4.7)
Alkaline Phosphatase: 80 IU/L (ref 48–121)
Bilirubin Total: <0.2 mg/dL (ref 0.0–1.2)
Bilirubin, Direct: 0.07 mg/dL (ref 0.00–0.40)
Total Protein: 7.5 g/dL (ref 6.0–8.5)

## 2019-09-16 LAB — LIPID PANEL
Chol/HDL Ratio: 3.3 ratio (ref 0.0–4.4)
Cholesterol, Total: 194 mg/dL (ref 100–199)
HDL: 59 mg/dL (ref >39)
LDL Chol Calc (NIH): 117 mg/dL — ABNORMAL HIGH (ref 0–99)
Triglycerides: 98 mg/dL (ref 0–149)
VLDL Cholesterol Cal: 18 mg/dL (ref 5–40)

## 2019-10-22 ENCOUNTER — Other Ambulatory Visit: Payer: Self-pay | Admitting: Internal Medicine

## 2019-11-05 DIAGNOSIS — H40011 Open angle with borderline findings, low risk, right eye: Secondary | ICD-10-CM | POA: Diagnosis not present

## 2019-11-05 DIAGNOSIS — Z961 Presence of intraocular lens: Secondary | ICD-10-CM | POA: Diagnosis not present

## 2019-11-05 DIAGNOSIS — H43811 Vitreous degeneration, right eye: Secondary | ICD-10-CM | POA: Diagnosis not present

## 2019-12-08 ENCOUNTER — Ambulatory Visit: Payer: Medicare Other | Admitting: Internal Medicine

## 2020-01-20 ENCOUNTER — Other Ambulatory Visit: Payer: Self-pay | Admitting: Internal Medicine

## 2020-02-11 ENCOUNTER — Ambulatory Visit (INDEPENDENT_AMBULATORY_CARE_PROVIDER_SITE_OTHER): Payer: Medicare PPO | Admitting: Internal Medicine

## 2020-02-11 ENCOUNTER — Other Ambulatory Visit: Payer: Self-pay

## 2020-02-11 ENCOUNTER — Ambulatory Visit (INDEPENDENT_AMBULATORY_CARE_PROVIDER_SITE_OTHER): Payer: Medicare PPO

## 2020-02-11 ENCOUNTER — Encounter: Payer: Self-pay | Admitting: Internal Medicine

## 2020-02-11 VITALS — BP 146/80 | HR 73 | Temp 98.4°F | Ht 64.2 in | Wt 213.0 lb

## 2020-02-11 VITALS — BP 156/82 | HR 73 | Temp 98.4°F | Ht 64.2 in | Wt 213.0 lb

## 2020-02-11 DIAGNOSIS — Z0000 Encounter for general adult medical examination without abnormal findings: Secondary | ICD-10-CM | POA: Diagnosis not present

## 2020-02-11 DIAGNOSIS — E7800 Pure hypercholesterolemia, unspecified: Secondary | ICD-10-CM | POA: Diagnosis not present

## 2020-02-11 DIAGNOSIS — I129 Hypertensive chronic kidney disease with stage 1 through stage 4 chronic kidney disease, or unspecified chronic kidney disease: Secondary | ICD-10-CM

## 2020-02-11 DIAGNOSIS — N182 Chronic kidney disease, stage 2 (mild): Secondary | ICD-10-CM

## 2020-02-11 DIAGNOSIS — Z23 Encounter for immunization: Secondary | ICD-10-CM

## 2020-02-11 DIAGNOSIS — R7309 Other abnormal glucose: Secondary | ICD-10-CM

## 2020-02-11 DIAGNOSIS — E559 Vitamin D deficiency, unspecified: Secondary | ICD-10-CM

## 2020-02-11 DIAGNOSIS — G5711 Meralgia paresthetica, right lower limb: Secondary | ICD-10-CM | POA: Diagnosis not present

## 2020-02-11 DIAGNOSIS — Z6836 Body mass index (BMI) 36.0-36.9, adult: Secondary | ICD-10-CM

## 2020-02-11 LAB — POCT URINALYSIS DIPSTICK
Bilirubin, UA: NEGATIVE
Blood, UA: NEGATIVE
Glucose, UA: NEGATIVE
Ketones, UA: NEGATIVE
Nitrite, UA: NEGATIVE
Protein, UA: NEGATIVE
Spec Grav, UA: 1.025 (ref 1.010–1.025)
Urobilinogen, UA: 0.2 E.U./dL
pH, UA: 6 (ref 5.0–8.0)

## 2020-02-11 NOTE — Patient Instructions (Signed)
Amy Serrano , Thank you for taking time to come for your Medicare Wellness Visit. I appreciate your ongoing commitment to your health goals. Please review the following plan we discussed and let me know if I can assist you in the future.   Screening recommendations/referrals: Colonoscopy: completed 12/08/2014, due 12/07/2024 Mammogram: patient to schedule Bone Density: completed 12/16/2018 Recommended yearly ophthalmology/optometry visit for glaucoma screening and checkup Recommended yearly dental visit for hygiene and checkup  Vaccinations: Influenza vaccine: today Pneumococcal vaccine: due Tdap vaccine: decline Shingles vaccine: discussed   Covid-19: 05/12/2019, 04/20/2019  Advanced directives: Advance directive discussed with you today. Even though you declined this today please call our office should you change your mind and we can give you the proper paperwork for you to fill out.  Conditions/risks identified: none  Next appointment: Follow up in one year for your annual wellness visit    Preventive Care 65 Years and Older, Female Preventive care refers to lifestyle choices and visits with your health care provider that can promote health and wellness. What does preventive care include?  A yearly physical exam. This is also called an annual well check.  Dental exams once or twice a year.  Routine eye exams. Ask your health care provider how often you should have your eyes checked.  Personal lifestyle choices, including:  Daily care of your teeth and gums.  Regular physical activity.  Eating a healthy diet.  Avoiding tobacco and drug use.  Limiting alcohol use.  Practicing safe sex.  Taking low-dose aspirin every day.  Taking vitamin and mineral supplements as recommended by your health care provider. What happens during an annual well check? The services and screenings done by your health care provider during your annual well check will depend on your age, overall  health, lifestyle risk factors, and family history of disease. Counseling  Your health care provider may ask you questions about your:  Alcohol use.  Tobacco use.  Drug use.  Emotional well-being.  Home and relationship well-being.  Sexual activity.  Eating habits.  History of falls.  Memory and ability to understand (cognition).  Work and work Statistician.  Reproductive health. Screening  You may have the following tests or measurements:  Height, weight, and BMI.  Blood pressure.  Lipid and cholesterol levels. These may be checked every 5 years, or more frequently if you are over 41 years old.  Skin check.  Lung cancer screening. You may have this screening every year starting at age 79 if you have a 30-pack-year history of smoking and currently smoke or have quit within the past 15 years.  Fecal occult blood test (FOBT) of the stool. You may have this test every year starting at age 15.  Flexible sigmoidoscopy or colonoscopy. You may have a sigmoidoscopy every 5 years or a colonoscopy every 10 years starting at age 40.  Hepatitis C blood test.  Hepatitis B blood test.  Sexually transmitted disease (STD) testing.  Diabetes screening. This is done by checking your blood sugar (glucose) after you have not eaten for a while (fasting). You may have this done every 1-3 years.  Bone density scan. This is done to screen for osteoporosis. You may have this done starting at age 97.  Mammogram. This may be done every 1-2 years. Talk to your health care provider about how often you should have regular mammograms. Talk with your health care provider about your test results, treatment options, and if necessary, the need for more tests. Vaccines  Your  health care provider may recommend certain vaccines, such as:  Influenza vaccine. This is recommended every year.  Tetanus, diphtheria, and acellular pertussis (Tdap, Td) vaccine. You may need a Td booster every 10  years.  Zoster vaccine. You may need this after age 59.  Pneumococcal 13-valent conjugate (PCV13) vaccine. One dose is recommended after age 53.  Pneumococcal polysaccharide (PPSV23) vaccine. One dose is recommended after age 72. Talk to your health care provider about which screenings and vaccines you need and how often you need them. This information is not intended to replace advice given to you by your health care provider. Make sure you discuss any questions you have with your health care provider. Document Released: 03/18/2015 Document Revised: 11/09/2015 Document Reviewed: 12/21/2014 Elsevier Interactive Patient Education  2017 [place] Prevention in the Home Falls can cause injuries. They can happen to people of all ages. There are many things you can do to make your home safe and to help prevent falls. What can I do on the outside of my home?  Regularly fix the edges of walkways and driveways and fix any cracks.  Remove anything that might make you trip as you walk through a door, such as a raised step or threshold.  Trim any bushes or trees on the path to your home.  Use bright outdoor lighting.  Clear any walking paths of anything that might make someone trip, such as rocks or tools.  Regularly check to see if handrails are loose or broken. Make sure that both sides of any steps have handrails.  Any raised decks and porches should have guardrails on the edges.  Have any leaves, snow, or ice cleared regularly.  Use sand or salt on walking paths during winter.  Clean up any spills in your garage right away. This includes oil or grease spills. What can I do in the bathroom?  Use night lights.  Install grab bars by the toilet and in the tub and shower. Do not use towel bars as grab bars.  Use non-skid mats or decals in the tub or shower.  If you need to sit down in the shower, use a plastic, non-slip stool.  Keep the floor dry. Clean up any water that  spills on the floor as soon as it happens.  Remove soap buildup in the tub or shower regularly.  Attach bath mats securely with double-sided non-slip rug tape.  Do not have throw rugs and other things on the floor that can make you trip. What can I do in the bedroom?  Use night lights.  Make sure that you have a light by your bed that is easy to reach.  Do not use any sheets or blankets that are too big for your bed. They should not hang down onto the floor.  Have a firm chair that has side arms. You can use this for support while you get dressed.  Do not have throw rugs and other things on the floor that can make you trip. What can I do in the kitchen?  Clean up any spills right away.  Avoid walking on wet floors.  Keep items that you use a lot in easy-to-reach places.  If you need to reach something above you, use a strong step stool that has a grab bar.  Keep electrical cords out of the way.  Do not use floor polish or wax that makes floors slippery. If you must use wax, use non-skid floor wax.  Do not  have throw rugs and other things on the floor that can make you trip. What can I do with my stairs?  Do not leave any items on the stairs.  Make sure that there are handrails on both sides of the stairs and use them. Fix handrails that are broken or loose. Make sure that handrails are as long as the stairways.  Check any carpeting to make sure that it is firmly attached to the stairs. Fix any carpet that is loose or worn.  Avoid having throw rugs at the top or bottom of the stairs. If you do have throw rugs, attach them to the floor with carpet tape.  Make sure that you have a light switch at the top of the stairs and the bottom of the stairs. If you do not have them, ask someone to add them for you. What else can I do to help prevent falls?  Wear shoes that:  Do not have high heels.  Have rubber bottoms.  Are comfortable and fit you well.  Are closed at the  toe. Do not wear sandals.  If you use a stepladder:  Make sure that it is fully opened. Do not climb a closed stepladder.  Make sure that both sides of the stepladder are locked into place.  Ask someone to hold it for you, if possible.  Clearly mark and make sure that you can see:  Any grab bars or handrails.  First and last steps.  Where the edge of each step is.  Use tools that help you move around (mobility aids) if they are needed. These include:  Canes.  Walkers.  Scooters.  Crutches.  Turn on the lights when you go into a dark area. Replace any light bulbs as soon as they burn out.  Set up your furniture so you have a clear path. Avoid moving your furniture around.  If any of your floors are uneven, fix them.  If there are any pets around you, be aware of where they are.  Review your medicines with your doctor. Some medicines can make you feel dizzy. This can increase your chance of falling. Ask your doctor what other things that you can do to help prevent falls. This information is not intended to replace advice given to you by your health care provider. Make sure you discuss any questions you have with your health care provider. Document Released: 12/16/2008 Document Revised: 07/28/2015 Document Reviewed: 03/26/2014 Elsevier Interactive Patient Education  2017 [employer].

## 2020-02-11 NOTE — Patient Instructions (Signed)
Health Maintenance After Age 75 After age 75, you are at a higher risk for certain long-term diseases and infections as well as injuries from falls. Falls are a major cause of broken bones and head injuries in people who are older than age 75. Getting regular preventive care can help to keep you healthy and well. Preventive care includes getting regular testing and making lifestyle changes as recommended by your health care provider. Talk with your health care provider about:  Which screenings and tests you should have. A screening is a test that checks for a disease when you have no symptoms.  A diet and exercise plan that is right for you. What should I know about screenings and tests to prevent falls? Screening and testing are the best ways to find a health problem early. Early diagnosis and treatment give you the best chance of managing medical conditions that are common after age 75. Certain conditions and lifestyle choices may make you more likely to have a fall. Your health care provider may recommend:  Regular vision checks. Poor vision and conditions such as cataracts can make you more likely to have a fall. If you wear glasses, make sure to get your prescription updated if your vision changes.  Medicine review. Work with your health care provider to regularly review all of the medicines you are taking, including over-the-counter medicines. Ask your health care provider about any side effects that may make you more likely to have a fall. Tell your health care provider if any medicines that you take make you feel dizzy or sleepy.  Osteoporosis screening. Osteoporosis is a condition that causes the bones to get weaker. This can make the bones weak and cause them to break more easily.  Blood pressure screening. Blood pressure changes and medicines to control blood pressure can make you feel dizzy.  Strength and balance checks. Your health care provider may recommend certain tests to check your  strength and balance while standing, walking, or changing positions.  Foot health exam. Foot pain and numbness, as well as not wearing proper footwear, can make you more likely to have a fall.  Depression screening. You may be more likely to have a fall if you have a fear of falling, feel emotionally low, or feel unable to do activities that you used to do.  Alcohol use screening. Using too much alcohol can affect your balance and may make you more likely to have a fall. What actions can I take to lower my risk of falls? General instructions  Talk with your health care provider about your risks for falling. Tell your health care provider if: ? You fall. Be sure to tell your health care provider about all falls, even ones that seem minor. ? You feel dizzy, sleepy, or off-balance.  Take over-the-counter and prescription medicines only as told by your health care provider. These include any supplements.  Eat a healthy diet and maintain a healthy weight. A healthy diet includes low-fat dairy products, low-fat (lean) meats, and fiber from whole grains, beans, and lots of fruits and vegetables. Home safety  Remove any tripping hazards, such as rugs, cords, and clutter.  Install safety equipment such as grab bars in bathrooms and safety rails on stairs.  Keep rooms and walkways well-lit. Activity   Follow a regular exercise program to stay fit. This will help you maintain your balance. Ask your health care provider what types of exercise are appropriate for you.  If you need a cane or   walker, use it as recommended by your health care provider.  Wear supportive shoes that have nonskid soles. Lifestyle  Do not drink alcohol if your health care provider tells you not to drink.  If you drink alcohol, limit how much you have: ? 0-1 drink a day for women. ? 0-2 drinks a day for men.  Be aware of how much alcohol is in your drink. In the [place] one drink equals one typical bottle of beer (12  oz), one-half glass of wine (5 oz), or one shot of hard liquor (1 oz).  Do not use any products that contain nicotine or tobacco, such as cigarettes and e-cigarettes. If you need help quitting, ask your health care provider. Summary  Having a healthy lifestyle and getting preventive care can help to protect your health and wellness after age 75.  Screening and testing are the best way to find a health problem early and help you avoid having a fall. Early diagnosis and treatment give you the best chance for managing medical conditions that are more common for people who are older than age 75.  Falls are a major cause of broken bones and head injuries in people who are older than age 75. Take precautions to prevent a fall at home.  Work with your health care provider to learn what changes you can make to improve your health and wellness and to prevent falls. This information is not intended to replace advice given to you by your health care provider. Make sure you discuss any questions you have with your health care provider. Document Revised: 06/12/2018 Document Reviewed: 01/02/2017 Elsevier Patient Education  2020 [employer]  

## 2020-02-11 NOTE — Progress Notes (Signed)
This visit occurred during the SARS-CoV-2 public health emergency.  Safety protocols were in place, including screening questions prior to the visit, additional usage of staff PPE, and extensive cleaning of exam room while observing appropriate contact time as indicated for disinfecting solutions.  Subjective:   Amy Serrano is a 74 y.o. female who presents for Medicare Annual (Subsequent) preventive examination.  Review of Systems     Cardiac Risk Factors include: advanced age (>40men, >66 women);hypertension;obesity (BMI >30kg/m2);sedentary lifestyle     Objective:    Today's Vitals   02/11/20 0945  BP: (!) 156/82  Pulse: 73  Temp: 98.4 F (36.9 C)  TempSrc: Oral  SpO2: 97%  Weight: 213 lb (96.6 kg)  Height: 5' 4.2" (1.631 m)   Body mass index is 36.33 kg/m.  Advanced Directives 02/11/2020 11/26/2018 01/01/2018 08/05/2015 08/07/2014 08/01/2014 05/18/2014  Does Patient Have a Medical Advance Directive? No No Yes No No No No  Type of Advance Directive - - [place]  Does patient want to make changes to medical advance directive? - - No - Patient declined - - - -  Copy of [place] in Chart? - - No - copy requested - - - -  Would patient like information on creating a medical advance directive? No - Patient declined - - No - patient declined information No - patient declined information No - patient declined information No - patient declined information    Current Medications (verified) Outpatient Encounter Medications as of 02/11/2020  Medication Sig  . amLODipine (NORVASC) 10 MG tablet TAKE 1 TABLET BY MOUTH DAILY  . aspirin 81 MG tablet Take 81 mg by mouth at bedtime.   . hydrochlorothiazide (HYDRODIURIL) 12.5 MG tablet   . Multiple Vitamin (MULTIVITAMIN) tablet Take 1 tablet by mouth daily.  . pravastatin (PRAVACHOL) 40 MG tablet TAKE 1 TABLET(40 MG) BY MOUTH EVERY EVENING  . Cholecalciferol (VITAMIN D3) 125 MCG (5000 UT) CAPS  Take 1 capsule by mouth daily.  . [DISCONTINUED] amLODipine-benazepril (LOTREL) 10-20 MG per capsule Take 1 capsule by mouth daily.  . [DISCONTINUED] dicyclomine (BENTYL) 10 MG capsule Take 2 capsules (20 mg total) by mouth 3 (three) times daily between meals as needed for spasms. (Patient not taking: Reported on 08/05/2015)  . [DISCONTINUED] famotidine (PEPCID) 20 MG tablet Take 1 tablet (20 mg total) by mouth 2 (two) times daily. (Patient not taking: Reported on 08/05/2015)  . [DISCONTINUED] metoCLOPramide (REGLAN) 10 MG tablet Take 1 tablet (10 mg total) by mouth every 6 (six) hours as needed for nausea (nausea/headache). (Patient not taking: Reported on 08/05/2015)   No facility-administered encounter medications on file as of 02/11/2020.    Allergies (verified) Benazepril and Ace inhibitors   History: Past Medical History:  Diagnosis Date  . Endometriosis   . Hyperlipidemia   . Hypertension    Past Surgical History:  Procedure Laterality Date  . arm surgery     left   Family History  Problem Relation Age of Onset  . Hypertension Other   . Stroke Other   . Hypertension Mother   . Heart attack Father    Social History   Socioeconomic History  . Marital status: Married    Spouse name: Not on file  . Number of children: Not on file  . Years of education: Not on file  . Highest education level: Not on file  Occupational History  . Occupation: retired  Tobacco Use  . Smoking status:  Never Smoker  . Smokeless tobacco: Never Used  Vaping Use  . Vaping Use: Never used  Substance and Sexual Activity  . Alcohol use: No  . Drug use: No  . Sexual activity: Not Currently  Other Topics Concern  . Not on file  Social History Narrative  . Not on file   Social Determinants of Health   Financial Resource Strain: Low Risk   . Difficulty of Paying Living Expenses: Not hard at all  Food Insecurity: No Food Insecurity  . Worried About Charity fundraiser in the Last Year: Never  true  . Ran Out of Food in the Last Year: Never true  Transportation Needs: No Transportation Needs  . Lack of Transportation (Medical): No  . Lack of Transportation (Non-Medical): No  Physical Activity: Inactive  . Days of Exercise per Week: 0 days  . Minutes of Exercise per Session: 0 min  Stress: No Stress Concern Present  . Feeling of Stress : Only a little  Social Connections: Not on file    Tobacco Counseling Counseling given: Not Answered   Clinical Intake:  Pre-visit preparation completed: Yes  Pain : No/denies pain     Nutritional Status: BMI > 30  Obese Nutritional Risks: None Diabetes: No  How often do you need to have someone help you when you read instructions, pamphlets, or other written materials from your doctor or pharmacy?: 1 - Never What is the last grade level you completed in school?: 2 1/2 yrs college  Diabetic? no  Interpreter Needed?: No  Information entered by :: NAllen LPN   Activities of Daily Living In your present state of health, do you have any difficulty performing the following activities: 02/11/2020  Hearing? N  Vision? N  Difficulty concentrating or making decisions? N  Walking or climbing stairs? N  Dressing or bathing? N  Doing errands, shopping? N  Preparing Food and eating ? N  Using the Toilet? N  In the past six months, have you accidently leaked urine? Y  Comment if laughs too hard  Do you have problems with loss of bowel control? N  Managing your Medications? N  Managing your Finances? N  Housekeeping or managing your Housekeeping? N  Some recent data might be hidden    Patient Care Team: Glendale Chard, MD as PCP - General (Internal Medicine) Marylynn Pearson, MD as Consulting Physician (Ophthalmology)  Indicate any recent Medical Services you may have received from other than [facility] providers in the past year (date may be approximate).     Assessment:   This is a routine wellness examination for  [facility].  Hearing/Vision screen  Hearing Screening   125Hz  250Hz  500Hz  1000Hz  2000Hz  3000Hz  4000Hz  6000Hz  8000Hz   Right ear:           Left ear:           Vision Screening Comments: Regular eye exams, Dr. Venetia Maxon  Dietary issues and exercise activities discussed: Current Exercise Habits: The patient does not participate in regular exercise at present  Goals    .  Patient Stated      11/26/2018, stay healthy    .  Patient Stated      02/11/2020, stay healthy    .  Weight (lb) < 200 lb (90.7 kg) (pt-stated)      Depression Screen PHQ 2/9 Scores 02/11/2020 07/29/2019 11/26/2018 06/19/2018 01/23/2018 01/01/2018 05/18/2014  PHQ - 2 Score 0 0 0 0 0 0 0  PHQ- 9 Score - - 0 - - - -  Fall Risk Fall Risk  02/11/2020 07/29/2019 01/27/2019 11/26/2018 06/19/2018  Falls in the past year? 0 0 0 0 0  Number falls in past yr: - 0 - 0 -  Injury with Fall? - 0 - - -  Risk for fall due to : Medication side effect - - Medication side effect -  Follow up Falls evaluation completed;Education provided;Falls prevention discussed - - Falls evaluation completed;Education provided;Falls prevention discussed -    FALL RISK PREVENTION PERTAINING TO THE HOME:  Any stairs in or around the home? Yes  If so, are there any without handrails? No  Home free of loose throw rugs in walkways, pet beds, electrical cords, etc? Yes  Adequate lighting in your home to reduce risk of falls? Yes   ASSISTIVE DEVICES UTILIZED TO PREVENT FALLS:  Life alert? No  Use of a cane, walker or w/c? No  Grab bars in the bathroom? No  Shower chair or bench in shower? No  Elevated toilet seat or a handicapped toilet? No   TIMED UP AND GO:  Was the test performed? No .    Gait steady and fast without use of assistive device  Cognitive Function:     6CIT Screen 02/11/2020 11/26/2018 01/01/2018  What Year? 0 points 0 points 0 points  What month? 0 points 0 points 0 points  What time? 0 points 0 points 0 points  Count back  from 20 0 points 0 points 0 points  Months in reverse 0 points 0 points 2 points  Repeat phrase 4 points 2 points 0 points  Total Score 4 2 2     Immunizations Immunization History  Administered Date(s) Administered  . Fluad Quad(high Dose 65+) 02/11/2020  . Influenza, High Dose Seasonal PF 12/02/2017, 11/26/2018  . Influenza-Unspecified 12/03/2013, 11/28/2017  . PFIZER SARS-COV-2 Vaccination 04/20/2019, 05/12/2019    TDAP status: Due, Education has been provided regarding the importance of this vaccine. Advised may receive this vaccine at local pharmacy or Health Dept. Aware to provide a copy of the vaccination record if obtained from local pharmacy or Health Dept. Verbalized acceptance and understanding.  Flu Vaccine status: Completed at today's visit  Pneumococcal vaccine status: Due, Education has been provided regarding the importance of this vaccine. Advised may receive this vaccine at local pharmacy or Health Dept. Aware to provide a copy of the vaccination record if obtained from local pharmacy or Health Dept. Verbalized acceptance and understanding.  Covid-19 vaccine status: Completed vaccines  Qualifies for Shingles Vaccine? Yes   Zostavax completed No   Shingrix Completed?: No.    Education has been provided regarding the importance of this vaccine. Patient has been advised to call insurance company to determine out of pocket expense if they have not yet received this vaccine. Advised may also receive vaccine at local pharmacy or Health Dept. Verbalized acceptance and understanding.  Screening Tests Health Maintenance  Topic Date Due  . PNA vac Low Risk Adult (2 of 2 - PPSV23) 03/12/2013  . COVID-19 Vaccine (3 - Booster for Pfizer series) 11/12/2019  . TETANUS/TDAP  02/10/2021 (Originally 08/10/1964)  . COLONOSCOPY  12/07/2024  . INFLUENZA VACCINE  Completed  . DEXA SCAN  Completed  . Hepatitis C Screening  Completed    Health Maintenance  Health Maintenance Due   Topic Date Due  . PNA vac Low Risk Adult (2 of 2 - PPSV23) 03/12/2013  . COVID-19 Vaccine (3 - Booster for Pfizer series) 11/12/2019    Colorectal cancer screening: Type of screening:  Colonoscopy. Completed 12/08/2014. Repeat every 10 years  Mammogram status:patient to schedule  Bone Density status: Completed 12/16/2018. Results reflect: Bone density results: NORMAL. Repeat every 0 years.  Lung Cancer Screening: (Low Dose CT Chest recommended if Age 53-80 years, 30 pack-year currently smoking OR have quit w/in 15years.) does not qualify.   Lung Cancer Screening Referral: no  Additional Screening:  Hepatitis C Screening: does qualify; Completed 06/19/2018  Vision Screening: Recommended annual ophthalmology exams for early detection of glaucoma and other disorders of the eye. Is the patient up to date with their annual eye exam?  Yes  Who is the provider or what is the name of the office in which the patient attends annual eye exams? Dr. Venetia Maxon If pt is not established with a provider, would they like to be referred to a provider to establish care? No .   Dental Screening: Recommended annual dental exams for proper oral hygiene  Community Resource Referral / Chronic Care Management: CRR required this visit?  No   CCM required this visit?  No      Plan:     I have personally reviewed and noted the following in the patient's chart:   . Medical and social history . Use of alcohol, tobacco or illicit drugs  . Current medications and supplements . Functional ability and status . Nutritional status . Physical activity . Advanced directives . List of other physicians . Hospitalizations, surgeries, and ER visits in previous 12 months . Vitals . Screenings to include cognitive, depression, and falls . Referrals and appointments  In addition, I have reviewed and discussed with patient certain preventive protocols, quality metrics, and best practice recommendations. A written  personalized care plan for preventive services as well as general preventive health recommendations were provided to patient.     Kellie Simmering, LPN   96/04/[id number]   Nurse Notes:

## 2020-02-11 NOTE — Progress Notes (Signed)
I,Katawbba Wiggins,acting as a Education administrator for Maximino Greenland, MD.,have documented all relevant documentation on the behalf of Maximino Greenland, MD,as directed by  Maximino Greenland, MD while in the presence of Maximino Greenland, MD.  This visit occurred during the SARS-CoV-2 public health emergency.  Safety protocols were in place, including screening questions prior to the visit, additional usage of staff PPE, and extensive cleaning of exam room while observing appropriate contact time as indicated for disinfecting solutions.  Subjective:     Patient ID: Amy Serrano , female    DOB: 1945-03-09 , 74 y.o.   MRN: [id number]   Chief Complaint  Patient presents with  . Annual Exam  . Hypertension  . Other    HPI  She is here today for a full physical exam. She is no longer followed by GYN. Unfortunately, this time of year is difficult for her. Her husband passed around this time last year. She reports she is doing "okay".   Hypertension This is a chronic problem. The current episode started more than 1 year ago. The problem has been gradually improving since onset. The problem is uncontrolled. Pertinent negatives include no blurred vision, chest pain, palpitations or shortness of breath. Past treatments include calcium channel blockers. The current treatment provides moderate improvement. Compliance problems include exercise.      Past Medical History:  Diagnosis Date  . Endometriosis   . Hyperlipidemia   . Hypertension      Family History  Problem Relation Age of Onset  . Hypertension Other   . Stroke Other   . Hypertension Mother   . Heart attack Father      Current Outpatient Medications:  .  amLODipine (NORVASC) 10 MG tablet, TAKE 1 TABLET BY MOUTH DAILY, Disp: 90 tablet, Rfl: 1 .  aspirin 81 MG tablet, Take 81 mg by mouth at bedtime. , Disp: , Rfl:  .  Cholecalciferol (VITAMIN D3) 125 MCG (5000 UT) CAPS, Take 1 capsule by mouth daily., Disp: , Rfl:  .  hydrochlorothiazide  (HYDRODIURIL) 12.5 MG tablet, , Disp: , Rfl:  .  Multiple Vitamin (MULTIVITAMIN) tablet, Take 1 tablet by mouth daily., Disp: , Rfl:  .  pravastatin (PRAVACHOL) 40 MG tablet, TAKE 1 TABLET(40 MG) BY MOUTH EVERY EVENING, Disp: 90 tablet, Rfl: 1   Allergies  Allergen Reactions  . Benazepril Swelling    Swelling of the face   . Ace Inhibitors     Angioedema      The patient states she uses post menopausal status for birth control. Last LMP was No LMP recorded. Patient is postmenopausal.. Negative for Dysmenorrhea. Negative for: breast discharge, breast lump(s), breast pain and breast self exam. Associated symptoms include abnormal vaginal bleeding. Pertinent negatives include abnormal bleeding (hematology), anxiety, decreased libido, depression, difficulty falling sleep, dyspareunia, history of infertility, nocturia, sexual dysfunction, sleep disturbances, urinary incontinence, urinary urgency, vaginal discharge and vaginal itching. Diet regular.The patient states her exercise level is   intermittent.   . The patient's tobacco use is:  Social History   Tobacco Use  Smoking Status Never Smoker  Smokeless Tobacco Never Used  . She has been exposed to passive smoke. The patient's alcohol use is:  Social History   Substance and Sexual Activity  Alcohol Use No    Review of Systems  Constitutional: Negative.   HENT: Negative.   Eyes: Negative.  Negative for blurred vision.  Respiratory: Negative.  Negative for shortness of breath.   Cardiovascular: Negative.  Negative  for chest pain and palpitations.  Gastrointestinal: Negative.   Endocrine: Negative.   Genitourinary: Negative.   Musculoskeletal: Negative.   Skin: Negative.   Allergic/Immunologic: Negative.   Neurological: Positive for numbness.       She c/o numbness/tingling on side of her right thigh. Denies fall/trauma. Unable to state what triggers her sx. She has not tried any otc meds for relief .  Hematological: Negative.    Psychiatric/Behavioral: Negative.      Today's Vitals   02/11/20 1018 02/11/20 1140  BP: (!) 156/82 (!) 146/80  Pulse: 73   Temp: 98.4 F (36.9 C)   TempSrc: Oral   Weight: 213 lb (96.6 kg)   Height: 5' 4.2" (1.631 m)   PainSc: 0-No pain    Body mass index is 36.33 kg/m.  Wt Readings from Last 3 Encounters:  02/11/20 213 lb (96.6 kg)  02/11/20 213 lb (96.6 kg)  09/15/19 205 lb 9.6 oz (93.3 kg)   BP Readings from Last 3 Encounters:  02/11/20 (!) 146/80  02/11/20 (!) 156/82  09/15/19 134/86    Objective:  Physical Exam Vitals and nursing note reviewed.  Constitutional:      Appearance: Normal appearance. She is obese.  HENT:     Head: Normocephalic and atraumatic.     Right Ear: Tympanic membrane, ear canal and external ear normal.     Left Ear: Tympanic membrane, ear canal and external ear normal.     Nose:     Comments: Deferred, masked    Mouth/Throat:     Comments: Deferred, masked Eyes:     Extraocular Movements: Extraocular movements intact.     Conjunctiva/sclera: Conjunctivae normal.     Pupils: Pupils are equal, round, and reactive to light.  Cardiovascular:     Rate and Rhythm: Normal rate and regular rhythm.     Pulses: Normal pulses.     Heart sounds: Normal heart sounds.  Pulmonary:     Effort: Pulmonary effort is normal.     Breath sounds: Normal breath sounds.  Chest:  Breasts:     Tanner Score is 5.     Right: Normal.     Left: Normal.    Abdominal:     General: Bowel sounds are normal.     Palpations: Abdomen is soft.     Comments: Soft, obese. Difficult to assess organomegaly.   Genitourinary:    Comments: deferred Musculoskeletal:        General: Normal range of motion.     Cervical back: Normal range of motion and neck supple.  Skin:    General: Skin is warm and dry.  Neurological:     General: No focal deficit present.     Mental Status: She is alert and oriented to person, place, and time.  Psychiatric:        Mood and  Affect: Mood normal.        Behavior: Behavior normal.         Assessment And Plan:     1. Routine general medical examination at health care facility Comments: A full exam was performed.  Importance of monthly self breast exams was discussed with the patient. PATIENT IS ADVISED TO GET 30-45 MINUTES REGULAR EXERCISE NO LESS THAN FOUR TO FIVE DAYS PER WEEK - BOTH WEIGHTBEARING EXERCISES AND AEROBIC ARE RECOMMENDED.  PATIENT IS ADVISED TO FOLLOW A HEALTHY DIET WITH AT LEAST SIX FRUITS/VEGGIES PER DAY, DECREASE INTAKE OF RED MEAT, AND TO INCREASE FISH INTAKE TO TWO DAYS PER   WEEK.  MEATS/FISH SHOULD NOT BE FRIED, BAKED OR BROILED IS PREFERABLE.  I SUGGEST WEARING SPF 50 SUNSCREEN ON EXPOSED PARTS AND ESPECIALLY WHEN IN THE DIRECT SUNLIGHT FOR AN EXTENDED PERIOD OF TIME.  PLEASE AVOID FAST FOOD RESTAURANTS AND INCREASE YOUR WATER INTAKE.  2. Hypertensive nephropathy Comments: Chronic, uncontrolled.  She admits she has yet to take her meds today. EKG performed, NSR w/ LVH. She agrees to rto in 2 weeks for a nurse visit/bp check. She is advised to take hctz in AM and amlodipine in the evenings. If still uncontrolled at next visit, I will consider adding another medication to her current regimen. Encouraged to follow low sodium diet.  - EKG 12-Lead - CBC - CMP14+EGFR  3. Chronic renal disease, stage II Comments: Chronic, this has been stable. I will check GFr, Cr today.  She is encouraged to stay well hydrated.   4. Other abnormal glucose Comments: Her a1c has been elevated in the past, I will recheck this today. Encouraged to avoid sugary beverages, including diet drinks.  - Hemoglobin A1c  5. Pure hypercholesterolemia Comments: Chronic. Previous labs reviewed. Advised goal LDL is less than 100. Encouraged to avoid fried foods and incorporate more exercise into her daily routine. She will continue with pravastatin 67m daily for now.   6. Mild vitamin D deficiency Comments: I will check a  vitamin D level and supplement as needed.  - VITAMIN D 25 Hydroxy (Vit-D Deficiency, Fractures)  7. Meralgia paresthetica of right side Comments: She is advised to consider use of lidocaine patch. Advised she can get OTC lidocaine patch to apply to affected area.   8. Class 2 severe obesity due to excess calories with serious comorbidity and body mass index (BMI) of 36.0 to 36.9 in adult ([facility] Comments: She is encouraged to strive for BMI less than 30 to decrease cardiac risk. Advised to aim for at least 150 minutes of exercise per week.     Patient was given opportunity to ask questions. Patient verbalized understanding of the plan and was able to repeat key elements of the plan. All questions were answered to their satisfaction.   RMaximino Greenland MD   I, RMaximino Greenland MD, have reviewed all documentation for this visit. The documentation on 02/13/20 for the exam, diagnosis, procedures, and orders are all accurate and complete.  THE PATIENT IS ENCOURAGED TO PRACTICE SOCIAL DISTANCING DUE TO THE COVID-19 PANDEMIC.

## 2020-02-12 LAB — CBC
Hematocrit: 37.3 % (ref 34.0–46.6)
Hemoglobin: 12 g/dL (ref 11.1–15.9)
MCH: 28.7 pg (ref 26.6–33.0)
MCHC: 32.2 g/dL (ref 31.5–35.7)
MCV: 89 fL (ref 79–97)
Platelets: 331 10*3/uL (ref 150–450)
RBC: 4.18 x10E6/uL (ref 3.77–5.28)
RDW: 13.4 % (ref 11.7–15.4)
WBC: 7.1 10*3/uL (ref 3.4–10.8)

## 2020-02-12 LAB — HEMOGLOBIN A1C
Est. average glucose Bld gHb Est-mCnc: 128 mg/dL
Hgb A1c MFr Bld: 6.1 % — ABNORMAL HIGH (ref 4.8–5.6)

## 2020-02-12 LAB — CMP14+EGFR
ALT: 15 IU/L (ref 0–32)
AST: 17 IU/L (ref 0–40)
Albumin/Globulin Ratio: 1.2 (ref 1.2–2.2)
Albumin: 4.1 g/dL (ref 3.7–4.7)
Alkaline Phosphatase: 84 IU/L (ref 44–121)
BUN/Creatinine Ratio: 20 (ref 12–28)
BUN: 19 mg/dL (ref 8–27)
Bilirubin Total: 0.2 mg/dL (ref 0.0–1.2)
CO2: 24 mmol/L (ref 20–29)
Calcium: 9.6 mg/dL (ref 8.7–10.3)
Chloride: 102 mmol/L (ref 96–106)
Creatinine, Ser: 0.95 mg/dL (ref 0.57–1.00)
GFR calc Af Amer: 68 mL/min/{1.73_m2} (ref >59)
GFR calc non Af Amer: 59 mL/min/{1.73_m2} — ABNORMAL LOW (ref >59)
Globulin, Total: 3.3 g/dL (ref 1.5–4.5)
Glucose: 85 mg/dL (ref 65–99)
Potassium: 4.7 mmol/L (ref 3.5–5.2)
Sodium: 139 mmol/L (ref 134–144)
Total Protein: 7.4 g/dL (ref 6.0–8.5)

## 2020-02-12 LAB — VITAMIN D 25 HYDROXY (VIT D DEFICIENCY, FRACTURES): Vit D, 25-Hydroxy: 40.6 ng/mL (ref 30.0–100.0)

## 2020-02-12 LAB — MICROALBUMIN, URINE: Microalbumin, Urine: <3 ug/mL

## 2020-02-15 ENCOUNTER — Other Ambulatory Visit: Payer: Self-pay | Admitting: Internal Medicine

## 2020-03-01 ENCOUNTER — Ambulatory Visit: Payer: Medicare PPO

## 2020-03-01 ENCOUNTER — Other Ambulatory Visit: Payer: Self-pay

## 2020-03-01 VITALS — BP 138/78 | HR 66 | Temp 97.7°F | Ht 65.6 in | Wt 214.6 lb

## 2020-03-01 DIAGNOSIS — I129 Hypertensive chronic kidney disease with stage 1 through stage 4 chronic kidney disease, or unspecified chronic kidney disease: Secondary | ICD-10-CM

## 2020-03-01 NOTE — Progress Notes (Signed)
Patient is here for blood pressure check. She would like to compare here blood pressure cuff to a manual reading. Her blood pressure today is 138/78 manually. He personal cuff read 145/77. Patient is currently taking amlodipine 10 htcz 12.5. Marland Kitchen BP Readings from Last 3 Encounters:  03/01/20 138/78  02/11/20 (!) 146/80  02/11/20 (!) 156/82

## 2020-03-15 ENCOUNTER — Ambulatory Visit: Payer: Medicare PPO

## 2020-03-15 ENCOUNTER — Other Ambulatory Visit: Payer: Self-pay

## 2020-03-15 VITALS — BP 128/72 | HR 65 | Temp 97.9°F | Ht 65.0 in | Wt 209.0 lb

## 2020-03-15 DIAGNOSIS — I129 Hypertensive chronic kidney disease with stage 1 through stage 4 chronic kidney disease, or unspecified chronic kidney disease: Secondary | ICD-10-CM

## 2020-03-15 NOTE — Progress Notes (Signed)
BP Readings from Last 3 Encounters:  03/15/20 128/72  03/01/20 138/78  02/11/20 (!) 146/80  pt came in today for a BPC. Pt readings are normal, provider advises her to cut back on starches, continue to drink water and exercise daily.

## 2020-06-10 ENCOUNTER — Other Ambulatory Visit: Payer: Self-pay | Admitting: Internal Medicine

## 2020-07-18 ENCOUNTER — Other Ambulatory Visit: Payer: Self-pay | Admitting: Internal Medicine

## 2020-08-11 ENCOUNTER — Ambulatory Visit: Payer: Medicare PPO | Admitting: Internal Medicine

## 2020-08-22 ENCOUNTER — Other Ambulatory Visit: Payer: Self-pay

## 2020-08-23 ENCOUNTER — Ambulatory Visit: Payer: Medicare PPO | Admitting: Nurse Practitioner

## 2020-08-23 ENCOUNTER — Encounter: Payer: Self-pay | Admitting: Nurse Practitioner

## 2020-08-23 ENCOUNTER — Other Ambulatory Visit: Payer: Self-pay

## 2020-08-23 VITALS — BP 124/70 | HR 72 | Temp 98.3°F | Ht 65.0 in | Wt 203.2 lb

## 2020-08-23 DIAGNOSIS — M5441 Lumbago with sciatica, right side: Secondary | ICD-10-CM | POA: Diagnosis not present

## 2020-08-23 MED ORDER — METHOCARBAMOL 500 MG PO TABS: 500.0000 mg | ORAL_TABLET | Freq: Four times a day (QID) | ORAL | 0 refills | Status: DC

## 2020-08-23 MED ORDER — KETOROLAC TROMETHAMINE 30 MG/ML IJ SOLN
30.0000 mg | Freq: Once | INTRAMUSCULAR | Status: AC
  Administered 2020-08-23: 30 mg via INTRAMUSCULAR

## 2020-08-23 NOTE — Progress Notes (Signed)
I,Katawbba Wiggins,acting as a Education administrator for [employer], FNP.,have documented all relevant documentation on the behalf of Minette Brine, FNP,as directed by  Minette Brine, FNP while in the presence of Minette Brine, [place].  This visit occurred during the SARS-CoV-2 public health emergency.  Safety protocols were in place, including screening questions prior to the visit, additional usage of staff PPE, and extensive cleaning of exam room while observing appropriate contact time as indicated for disinfecting solutions.  Subjective:     Patient ID: Amy Serrano , female    DOB: 07-13-1945 , 75 y.o.   MRN: [id number]   Chief Complaint  Patient presents with   Hip Pain    Right side    HPI  The patient is here today for evaluation of hip pain.  A  few weeks ago started burning and began radiating down her right leg. She tried to stretch and got on treadmill without good results. She took one tylenol without relief. When she took 2 and had better results. Throbbing toothache.   Wt Readings from Last 3 Encounters: 08/23/20 : 203 lb 3.2 oz (92.2 kg) 03/15/20 : 209 lb (94.8 kg) 03/01/20 : 214 lb 9.6 oz (97.3 kg)    Hip Pain  The incident occurred more than 1 week ago. The pain is present in the right hip. The pain is at a severity of 6/10. Pertinent negatives include no numbness or tingling.  Back Pain This is a recurrent problem. The current episode started 1 to 4 weeks ago. The problem occurs constantly. The problem is unchanged. The pain is present in the gluteal. The quality of the pain is described as aching. The pain radiates to the right thigh. The pain is at a severity of 7/10. The pain is moderate. The pain is Worse during the night. The symptoms are aggravated by standing. Stiffness is present In the morning. Pertinent negatives include no abdominal pain, bladder incontinence, bowel incontinence, numbness, paresthesias or tingling. Risk factors include lack of exercise. She has tried  NSAIDs for the symptoms. The treatment provided mild relief.    Past Medical History:  Diagnosis Date   Endometriosis    Hyperlipidemia    Hypertension      Family History  Problem Relation Age of Onset   Hypertension Other    Stroke Other    Hypertension Mother    Heart attack Father      Current Outpatient Medications:    amLODipine (NORVASC) 10 MG tablet, TAKE 1 TABLET BY MOUTH DAILY, Disp: 90 tablet, Rfl: 1   aspirin 81 MG tablet, Take 81 mg by mouth at bedtime. , Disp: , Rfl:    Cholecalciferol (VITAMIN D3) 125 MCG (5000 UT) CAPS, Take 1 capsule by mouth daily., Disp: , Rfl:    hydrochlorothiazide (HYDRODIURIL) 12.5 MG tablet, TAKE 1 TABLET(12.5 MG) BY MOUTH 3 TIMES A WEEK (Patient taking differently: 2 tabs 3 days per week), Disp: 30 tablet, Rfl: 1   methocarbamol (ROBAXIN) 500 MG tablet, Take 1 tablet (500 mg total) by mouth 4 (four) times daily., Disp: 30 tablet, Rfl: 0   Multiple Vitamin (MULTIVITAMIN) tablet, Take 1 tablet by mouth daily., Disp: , Rfl:    pravastatin (PRAVACHOL) 40 MG tablet, TAKE 1 TABLET(40 MG) BY MOUTH EVERY EVENING, Disp: 90 tablet, Rfl: 1   Allergies  Allergen Reactions   Benazepril Swelling    Swelling of the face    Ace Inhibitors     Angioedema     Review of Systems  Constitutional: Negative.   Respiratory: Negative.    Cardiovascular: Negative.   Gastrointestinal:  Negative for abdominal pain and bowel incontinence.  Genitourinary:  Negative for bladder incontinence.  Musculoskeletal:  Positive for back pain.  Neurological:  Negative for tingling, numbness and paresthesias.    Today's Vitals   08/23/20 1610  BP: 124/70  Pulse: 72  Temp: 98.3 F (36.8 C)  TempSrc: Oral  Weight: 203 lb 3.2 oz (92.2 kg)  Height: 5\' 5"  (1.651 m)  PainSc: 6   PainLoc: Hip   Body mass index is 33.81 kg/m.   Objective:  Physical Exam Constitutional:      General: She is not in acute distress.    Appearance: Normal appearance.   Cardiovascular:     Rate and Rhythm: Normal rate and regular rhythm.     Pulses: Normal pulses.     Heart sounds: Normal heart sounds. No murmur heard. Pulmonary:     Effort: Pulmonary effort is normal. No respiratory distress.     Breath sounds: Normal breath sounds. No wheezing.  Musculoskeletal:     Comments: Tenderness to left buttocks, limited range of motion  Neurological:     General: No focal deficit present.     Mental Status: She is alert and oriented to person, place, and time.     Cranial Nerves: No cranial nerve deficit.  Psychiatric:        Mood and Affect: Mood normal.        Behavior: Behavior normal.        Thought Content: Thought content normal.        Judgment: Judgment normal.        Assessment And Plan:     1. Acute right-sided low back pain with right-sided sciatica Will treat with toradol 30 mg, I have also given her stretching exercises. If not better on Thursday return call to office she may need steroids.  - ketorolac (TORADOL) 30 MG/ML injection 30 mg - methocarbamol (ROBAXIN) 500 MG tablet; Take 1 tablet (500 mg total) by mouth 4 (four) times daily.  Dispense: 30 tablet; Refill: 0    Patient was given opportunity to ask questions. Patient verbalized understanding of the plan and was able to repeat key elements of the plan. All questions were answered to their satisfaction.  Minette Brine, FNP   I, Minette Brine, FNP, have reviewed all documentation for this visit. The documentation on 08/23/20 for the exam, diagnosis, procedures, and orders are all accurate and complete.   IF YOU HAVE BEEN REFERRED TO A SPECIALIST, IT MAY TAKE 1-2 WEEKS TO SCHEDULE/PROCESS THE REFERRAL. IF YOU HAVE NOT HEARD FROM US/SPECIALIST IN TWO WEEKS, PLEASE GIVE [place] A CALL AT [phone].   THE PATIENT IS ENCOURAGED TO PRACTICE SOCIAL DISTANCING DUE TO THE COVID-19 PANDEMIC.

## 2020-09-23 ENCOUNTER — Other Ambulatory Visit: Payer: Self-pay | Admitting: Internal Medicine

## 2020-10-16 ENCOUNTER — Other Ambulatory Visit: Payer: Self-pay | Admitting: Internal Medicine

## 2020-11-10 DIAGNOSIS — H40011 Open angle with borderline findings, low risk, right eye: Secondary | ICD-10-CM | POA: Diagnosis not present

## 2020-11-10 DIAGNOSIS — H43813 Vitreous degeneration, bilateral: Secondary | ICD-10-CM | POA: Diagnosis not present

## 2020-11-10 DIAGNOSIS — Z961 Presence of intraocular lens: Secondary | ICD-10-CM | POA: Diagnosis not present

## 2020-12-22 DIAGNOSIS — Z7982 Long term (current) use of aspirin: Secondary | ICD-10-CM | POA: Diagnosis not present

## 2020-12-22 DIAGNOSIS — Z6834 Body mass index (BMI) 34.0-34.9, adult: Secondary | ICD-10-CM | POA: Diagnosis not present

## 2020-12-22 DIAGNOSIS — E669 Obesity, unspecified: Secondary | ICD-10-CM | POA: Diagnosis not present

## 2020-12-22 DIAGNOSIS — Z8249 Family history of ischemic heart disease and other diseases of the circulatory system: Secondary | ICD-10-CM | POA: Diagnosis not present

## 2020-12-22 DIAGNOSIS — E785 Hyperlipidemia, unspecified: Secondary | ICD-10-CM | POA: Diagnosis not present

## 2020-12-22 DIAGNOSIS — E119 Type 2 diabetes mellitus without complications: Secondary | ICD-10-CM | POA: Diagnosis not present

## 2020-12-22 DIAGNOSIS — I10 Essential (primary) hypertension: Secondary | ICD-10-CM | POA: Diagnosis not present

## 2020-12-22 DIAGNOSIS — Z823 Family history of stroke: Secondary | ICD-10-CM | POA: Diagnosis not present

## 2021-02-06 ENCOUNTER — Emergency Department (HOSPITAL_COMMUNITY)
Admission: EM | Admit: 2021-02-06 | Discharge: 2021-02-06 | Disposition: A | Discharge status: Home / Self Care | Payer: Medicare PPO | Attending: Emergency Medicine | Admitting: Emergency Medicine

## 2021-02-06 DIAGNOSIS — I129 Hypertensive chronic kidney disease with stage 1 through stage 4 chronic kidney disease, or unspecified chronic kidney disease: Secondary | ICD-10-CM | POA: Diagnosis not present

## 2021-02-06 DIAGNOSIS — T7809XA Anaphylactic reaction due to other food products, initial encounter: Secondary | ICD-10-CM | POA: Diagnosis not present

## 2021-02-06 DIAGNOSIS — N182 Chronic kidney disease, stage 2 (mild): Secondary | ICD-10-CM | POA: Insufficient documentation

## 2021-02-06 DIAGNOSIS — T782XXA Anaphylactic shock, unspecified, initial encounter: Secondary | ICD-10-CM | POA: Diagnosis not present

## 2021-02-06 DIAGNOSIS — Z79899 Other long term (current) drug therapy: Secondary | ICD-10-CM | POA: Insufficient documentation

## 2021-02-06 DIAGNOSIS — L509 Urticaria, unspecified: Secondary | ICD-10-CM | POA: Diagnosis present

## 2021-02-06 LAB — BASIC METABOLIC PANEL
Anion gap: 5 (ref 5–15)
BUN: 23 mg/dL (ref 8–23)
CO2: 23 mmol/L (ref 22–32)
Calcium: 9 mg/dL (ref 8.9–10.3)
Chloride: 110 mmol/L (ref 98–111)
Creatinine, Ser: 1.01 mg/dL — ABNORMAL HIGH (ref 0.44–1.00)
GFR, Estimated: 58 mL/min — ABNORMAL LOW (ref >60)
Glucose, Bld: 163 mg/dL — ABNORMAL HIGH (ref 70–99)
Potassium: 4 mmol/L (ref 3.5–5.1)
Sodium: 138 mmol/L (ref 135–145)

## 2021-02-06 LAB — CBC WITH DIFFERENTIAL/PLATELET
Abs Immature Granulocytes: 0.03 10*3/uL (ref 0.00–0.07)
Basophils Absolute: 0 10*3/uL (ref 0.0–0.1)
Basophils Relative: 0 %
Eosinophils Absolute: 0 10*3/uL (ref 0.0–0.5)
Eosinophils Relative: 0 %
HCT: 43.3 % (ref 36.0–46.0)
Hemoglobin: 13.7 g/dL (ref 12.0–15.0)
Immature Granulocytes: 0 %
Lymphocytes Relative: 18 %
Lymphs Abs: 1.7 10*3/uL (ref 0.7–4.0)
MCH: 28 pg (ref 26.0–34.0)
MCHC: 31.6 g/dL (ref 30.0–36.0)
MCV: 88.4 fL (ref 80.0–100.0)
Monocytes Absolute: 0.6 10*3/uL (ref 0.1–1.0)
Monocytes Relative: 7 %
Neutro Abs: 7 10*3/uL (ref 1.7–7.7)
Neutrophils Relative %: 75 %
Platelets: 372 10*3/uL (ref 150–400)
RBC: 4.9 MIL/uL (ref 3.87–5.11)
RDW: 14.5 % (ref 11.5–15.5)
WBC: 9.4 10*3/uL (ref 4.0–10.5)
nRBC: 0 % (ref 0.0–0.2)

## 2021-02-06 MED ORDER — CETIRIZINE HCL 10 MG PO TABS: 10.0000 mg | ORAL_TABLET | Freq: Every day | ORAL | 0 refills | Status: DC

## 2021-02-06 MED ORDER — SODIUM CHLORIDE 0.9 % IV BOLUS
1000.0000 mL | Freq: Once | INTRAVENOUS | Status: AC
  Administered 2021-02-06: 1000 mL via INTRAVENOUS

## 2021-02-06 MED ORDER — DIPHENHYDRAMINE HCL 50 MG/ML IJ SOLN: 8.0000 mg | Freq: Once | INTRAMUSCULAR | Status: DC

## 2021-02-06 MED ORDER — PREDNISONE 20 MG PO TABS: 20.0000 mg | ORAL_TABLET | Freq: Two times a day (BID) | ORAL | 0 refills | Status: AC

## 2021-02-06 MED ORDER — DEXAMETHASONE SODIUM PHOSPHATE 10 MG/ML IJ SOLN
10.0000 mg | Freq: Once | INTRAMUSCULAR | Status: AC
  Administered 2021-02-06: 10 mg via INTRAVENOUS
  Filled 2021-02-06: qty 1

## 2021-02-06 MED ORDER — EPINEPHRINE 0.3 MG/0.3ML IJ SOAJ: 0.3000 mg | INTRAMUSCULAR | 0 refills | Status: AC | PRN

## 2021-02-06 MED ORDER — EPINEPHRINE 0.3 MG/0.3ML IJ SOAJ
0.3000 mg | Freq: Once | INTRAMUSCULAR | Status: AC
  Administered 2021-02-06: 0.3 mg via INTRAMUSCULAR
  Filled 2021-02-06: qty 0.3

## 2021-02-06 MED ORDER — DIPHENHYDRAMINE HCL 50 MG/ML IJ SOLN
25.0000 mg | Freq: Once | INTRAMUSCULAR | Status: AC
  Administered 2021-02-06: 25 mg via INTRAVENOUS
  Filled 2021-02-06: qty 1

## 2021-02-06 NOTE — ED Provider Notes (Signed)
[facility] DEPT Provider Note   CSN: [id number] Arrival date & time: 02/06/21  4627     History Chief Complaint  Patient presents with   Urticaria   Oral Swelling    Amy Serrano is a 75 y.o. female.  Patient presents to ER for generalized itchiness and rash with tongue swelling.  Symptoms began yesterday.  She states that she ate some cookies with macadamia nuts in it.  She has not had any prior food allergies to her knowledge.  She noticed hives diffusely across her body yesterday.  She went to bed and woke up this morning and noticed her tongue was swollen on the left side this morning with continued rash across her body.  She otherwise denies any difficulty breathing.  No fevers no cough no vomiting no diarrhea reported.  She did not take any medications prior to arrival.      Past Medical History:  Diagnosis Date   Endometriosis    Hyperlipidemia    Hypertension     Patient Active Problem List   Diagnosis Date Noted   Hypertensive nephropathy 01/01/2018   Chronic renal disease, stage II 01/01/2018   Other abnormal glucose 01/01/2018   Class 2 drug-induced obesity with serious comorbidity and body mass index (BMI) of 35.0 to 35.9 in adult 01/01/2018    Past Surgical History:  Procedure Laterality Date   arm surgery     left     OB History   No obstetric history on file.     Family History  Problem Relation Age of Onset   Hypertension Other    Stroke Other    Hypertension Mother    Heart attack Father     Social History   Tobacco Use   Smoking status: Never   Smokeless tobacco: Never  Vaping Use   Vaping Use: Never used  Substance Use Topics   Alcohol use: No   Drug use: No    Home Medications Prior to Admission medications   Medication Sig Start Date End Date Taking? Authorizing Provider  cetirizine (ZYRTEC ALLERGY) 10 MG tablet Take 1 tablet (10 mg total) by mouth daily for 5 days. 02/06/21 02/11/21 Yes  Luna Fuse, MD  EPINEPHrine 0.3 mg/0.3 mL IJ SOAJ injection Inject 0.3 mg into the muscle as needed for anaphylaxis. 02/06/21  Yes Luna Fuse, MD  predniSONE (DELTASONE) 20 MG tablet Take 1 tablet (20 mg total) by mouth 2 (two) times daily with a meal for 5 days. 02/06/21 02/11/21 Yes Luna Fuse, MD  amLODipine (NORVASC) 10 MG tablet TAKE 1 TABLET BY MOUTH DAILY 10/17/20   Glendale Chard, MD  aspirin 81 MG tablet Take 81 mg by mouth at bedtime.     [provider]  Cholecalciferol (VITAMIN D3) 125 MCG (5000 UT) CAPS Take 1 capsule by mouth daily.    [provider]  hydrochlorothiazide (HYDRODIURIL) 12.5 MG tablet TAKE 1 TABLET(12.5 MG) BY MOUTH 3 TIMES A WEEK 09/23/20   Glendale Chard, MD  methocarbamol (ROBAXIN) 500 MG tablet Take 1 tablet (500 mg total) by mouth 4 (four) times daily. 08/23/20   Minette Brine, FNP  Multiple Vitamin (MULTIVITAMIN) tablet Take 1 tablet by mouth daily.    [provider]  pravastatin (PRAVACHOL) 40 MG tablet TAKE 1 TABLET(40 MG) BY MOUTH EVERY EVENING 07/18/20   Glendale Chard, MD  amLODipine-benazepril (LOTREL) 10-20 MG per capsule Take 1 capsule by mouth daily.  08/07/14  [provider]  dicyclomine (  BENTYL) 10 MG capsule Take 2 capsules (20 mg total) by mouth 3 (three) times daily between meals as needed for spasms. Patient not taking: Reported on 08/05/2015 08/01/14 08/05/15  Orlie Dakin, MD  famotidine (PEPCID) 20 MG tablet Take 1 tablet (20 mg total) by mouth 2 (two) times daily. Patient not taking: Reported on 08/05/2015 08/07/14 08/05/15  Noemi Chapel, MD  metoCLOPramide (REGLAN) 10 MG tablet Take 1 tablet (10 mg total) by mouth every 6 (six) hours as needed for nausea (nausea/headache). Patient not taking: Reported on 08/05/2015 08/01/14 08/05/15  Orlie Dakin, MD    Allergies    Benazepril and Ace inhibitors  Review of Systems   Review of Systems  Constitutional:  Negative for fever.  HENT:  Negative for ear pain.    Eyes:  Negative for pain.  Respiratory:  Negative for cough.   Cardiovascular:  Negative for chest pain.  Gastrointestinal:  Negative for abdominal pain.  Genitourinary:  Negative for flank pain.  Musculoskeletal:  Negative for back pain.  Skin:  Positive for rash.  Neurological:  Negative for headaches.   Physical Exam Updated Vital Signs BP (!) 166/72   Pulse 71   Temp 98.6 F (37 C) (Oral)   Resp (!) 21   SpO2 100%   Physical Exam Constitutional:      General: She is not in acute distress.    Appearance: Normal appearance.  HENT:     Head: Normocephalic.     Comments: Mild to moderate left-sided tongue swelling noted.  Airway otherwise patent phonation normal.  Maintaining her secretions as per normal.    Nose: Nose normal.  Eyes:     Extraocular Movements: Extraocular movements intact.  Cardiovascular:     Rate and Rhythm: Normal rate.  Pulmonary:     Effort: Pulmonary effort is normal.  Musculoskeletal:        General: Normal range of motion.     Cervical back: Normal range of motion.  Skin:    Comments: Sparse, hive-like rash noted along waistband and bilateral shoulders.  Neurological:     General: No focal deficit present.     Mental Status: She is alert. Mental status is at baseline.    ED Results / Procedures / Treatments   Labs (all labs ordered are listed, but only abnormal results are displayed) Labs Reviewed  BASIC METABOLIC PANEL - Abnormal; Notable for the following components:      Result Value   Glucose, Bld 163 (*)    Creatinine, Ser 1.01 (*)    GFR, Estimated 58 (*)    All other components within normal limits  CBC WITH DIFFERENTIAL/PLATELET    EKG None  Radiology No results found.  Procedures .Critical Care Performed by: Luna Fuse, MD Authorized by: Luna Fuse, MD   Critical care provider statement:    Critical care time (minutes):  40   Critical care time was exclusive of:  Separately billable procedures and treating  other patients and teaching time Comments:     Anaphylactic reaction   Medications Ordered in ED Medications  dexamethasone (DECADRON) injection 10 mg (10 mg Intravenous Given 02/06/21 1017)  EPINEPHrine (EPI-PEN) injection 0.3 mg (0.3 mg Intramuscular Given 02/06/21 1016)  sodium chloride 0.9 % bolus 1,000 mL (1,000 mLs Intravenous Bolus 02/06/21 1019)  diphenhydrAMINE (BENADRYL) injection 25 mg (25 mg Intravenous Given 02/06/21 1016)    ED Course  I have reviewed the triage vital signs and the nursing notes.  Pertinent labs & imaging  results that were available during my care of the patient were reviewed by me and considered in my medical decision making (see chart for details).    MDM Rules/Calculators/A&P                           Concern for anaphylactic allergic reaction.  Given Benadryl Decadron and epi and IV fluids.  Patient monitored for several hours with continued improvement in rash and swelling.  Recommending outpatient follow-up and follow-up with allergist within the week.  Advising immediate return for worsening symptoms difficulty breathing swelling or any additional concerns.   Final Clinical Impression(s) / ED Diagnoses Final diagnoses:  Anaphylaxis, initial encounter    Rx / DC Orders ED Discharge Orders          Ordered    cetirizine (ZYRTEC ALLERGY) 10 MG tablet  Daily        02/06/21 1228    EPINEPHrine 0.3 mg/0.3 mL IJ SOAJ injection  As needed        02/06/21 1228    predniSONE (DELTASONE) 20 MG tablet  2 times daily with meals        02/06/21 1228             Luna Fuse, MD 02/06/21 1229

## 2021-02-06 NOTE — Discharge Instructions (Signed)
Call your primary care doctor or specialist as discussed in the next 2-3 days.   Return immediately back to the ER if:  Your symptoms worsen within the next 12-24 hours. You develop new symptoms such as new fevers, persistent vomiting, new pain, shortness of breath, or new weakness or numbness, or if you have any other concerns.  

## 2021-02-06 NOTE — ED Triage Notes (Signed)
Patient reports that she began having hives all over, and tongue swelling that started this AM.

## 2021-02-15 ENCOUNTER — Ambulatory Visit: Payer: Medicare PPO | Admitting: Internal Medicine

## 2021-02-15 ENCOUNTER — Ambulatory Visit (INDEPENDENT_AMBULATORY_CARE_PROVIDER_SITE_OTHER): Payer: Medicare PPO

## 2021-02-15 ENCOUNTER — Other Ambulatory Visit: Payer: Self-pay

## 2021-02-15 ENCOUNTER — Encounter: Payer: Self-pay | Admitting: Internal Medicine

## 2021-02-15 VITALS — BP 114/80 | HR 67 | Temp 98.8°F | Ht 65.0 in | Wt 202.6 lb

## 2021-02-15 VITALS — BP 114/80 | HR 67 | Temp 98.8°F | Ht 65.0 in | Wt 202.0 lb

## 2021-02-15 DIAGNOSIS — I129 Hypertensive chronic kidney disease with stage 1 through stage 4 chronic kidney disease, or unspecified chronic kidney disease: Secondary | ICD-10-CM | POA: Diagnosis not present

## 2021-02-15 DIAGNOSIS — T782XXD Anaphylactic shock, unspecified, subsequent encounter: Secondary | ICD-10-CM | POA: Diagnosis not present

## 2021-02-15 DIAGNOSIS — Z6833 Body mass index (BMI) 33.0-33.9, adult: Secondary | ICD-10-CM | POA: Diagnosis not present

## 2021-02-15 DIAGNOSIS — R7309 Other abnormal glucose: Secondary | ICD-10-CM

## 2021-02-15 DIAGNOSIS — E6609 Other obesity due to excess calories: Secondary | ICD-10-CM | POA: Diagnosis not present

## 2021-02-15 DIAGNOSIS — Z0000 Encounter for general adult medical examination without abnormal findings: Secondary | ICD-10-CM | POA: Diagnosis not present

## 2021-02-15 DIAGNOSIS — N182 Chronic kidney disease, stage 2 (mild): Secondary | ICD-10-CM

## 2021-02-15 DIAGNOSIS — N289 Disorder of kidney and ureter, unspecified: Secondary | ICD-10-CM

## 2021-02-15 DIAGNOSIS — L509 Urticaria, unspecified: Secondary | ICD-10-CM | POA: Diagnosis not present

## 2021-02-15 LAB — POCT URINALYSIS DIPSTICK
Bilirubin, UA: NEGATIVE
Blood, UA: NEGATIVE
Glucose, UA: POSITIVE — AB
Ketones, UA: NEGATIVE
Leukocytes, UA: NEGATIVE
Nitrite, UA: NEGATIVE
Protein, UA: NEGATIVE
Spec Grav, UA: 1.02 (ref 1.010–1.025)
Urobilinogen, UA: 0.2 E.U./dL
pH, UA: 6 (ref 5.0–8.0)

## 2021-02-15 LAB — POCT UA - MICROALBUMIN
Albumin/Creatinine Ratio, Urine, POC: <30
Creatinine, POC: 300 mg/dL
Microalbumin Ur, POC: 30 mg/L

## 2021-02-15 MED ORDER — CETIRIZINE HCL 10 MG PO TABS: 10.0000 mg | ORAL_TABLET | Freq: Every day | ORAL | 2 refills | Status: DC

## 2021-02-15 NOTE — Patient Instructions (Signed)
Amy Serrano , Thank you for taking time to come for your Medicare Wellness Visit. I appreciate your ongoing commitment to your health goals. Please review the following plan we discussed and let me know if I can assist you in the future.   Screening recommendations/referrals: Colonoscopy: not required Mammogram: patient to schedule Bone Density: completed 12/16/2018 Recommended yearly ophthalmology/optometry visit for glaucoma screening and checkup Recommended yearly dental visit for hygiene and checkup  Vaccinations: Influenza vaccine: completed 01/23/2021 Pneumococcal vaccine: decline Tdap vaccine: due Shingles vaccine: discussed   Covid-19:12/16/2020, 12/07/2019, 05/12/2019, 04/20/2019  Advanced directives: Advance directive discussed with you today. Even though you declined this today please call our office should you change your mind and we can give you the proper paperwork for you to fill out.  Conditions/risks identified: none  Next appointment: Follow up in one year for your annual wellness visit    Preventive Care 76 Years and Older, Female Preventive care refers to lifestyle choices and visits with your health care provider that can promote health and wellness. What does preventive care include? A yearly physical exam. This is also called an annual well check. Dental exams once or twice a year. Routine eye exams. Ask your health care provider how often you should have your eyes checked. Personal lifestyle choices, including: Daily care of your teeth and gums. Regular physical activity. Eating a healthy diet. Avoiding tobacco and drug use. Limiting alcohol use. Practicing safe sex. Taking low-dose aspirin every day. Taking vitamin and mineral supplements as recommended by your health care provider. What happens during an annual well check? The services and screenings done by your health care provider during your annual well check will depend on your age, overall health,  lifestyle risk factors, and family history of disease. Counseling  Your health care provider may ask you questions about your: Alcohol use. Tobacco use. Drug use. Emotional well-being. Home and relationship well-being. Sexual activity. Eating habits. History of falls. Memory and ability to understand (cognition). Work and work Statistician. Reproductive health. Screening  You may have the following tests or measurements: Height, weight, and BMI. Blood pressure. Lipid and cholesterol levels. These may be checked every 5 years, or more frequently if you are over 23 years old. Skin check. Lung cancer screening. You may have this screening every year starting at age 76 if you have a 30-pack-year history of smoking and currently smoke or have quit within the past 15 years. Fecal occult blood test (FOBT) of the stool. You may have this test every year starting at age 76. Flexible sigmoidoscopy or colonoscopy. You may have a sigmoidoscopy every 5 years or a colonoscopy every 10 years starting at age 76. Hepatitis C blood test. Hepatitis B blood test. Sexually transmitted disease (STD) testing. Diabetes screening. This is done by checking your blood sugar (glucose) after you have not eaten for a while (fasting). You may have this done every 1-3 years. Bone density scan. This is done to screen for osteoporosis. You may have this done starting at age 76. Mammogram. This may be done every 1-2 years. Talk to your health care provider about how often you should have regular mammograms. Talk with your health care provider about your test results, treatment options, and if necessary, the need for more tests. Vaccines  Your health care provider may recommend certain vaccines, such as: Influenza vaccine. This is recommended every year. Tetanus, diphtheria, and acellular pertussis (Tdap, Td) vaccine. You may need a Td booster every 10 years. Zoster vaccine. You  may need this after age  76. Pneumococcal 13-valent conjugate (PCV13) vaccine. One dose is recommended after age 76. Pneumococcal polysaccharide (PPSV23) vaccine. One dose is recommended after age 76. Talk to your health care provider about which screenings and vaccines you need and how often you need them. This information is not intended to replace advice given to you by your health care provider. Make sure you discuss any questions you have with your health care provider. Document Released: 03/18/2015 Document Revised: 11/09/2015 Document Reviewed: 12/21/2014 Elsevier Interactive Patient Education  2017 [employer] Prevention in the Home Falls can cause injuries. They can happen to people of all ages. There are many things you can do to make your home safe and to help prevent falls. What can I do on the outside of my home? Regularly fix the edges of walkways and driveways and fix any cracks. Remove anything that might make you trip as you walk through a door, such as a raised step or threshold. Trim any bushes or trees on the path to your home. Use bright outdoor lighting. Clear any walking paths of anything that might make someone trip, such as rocks or tools. Regularly check to see if handrails are loose or broken. Make sure that both sides of any steps have handrails. Any raised decks and porches should have guardrails on the edges. Have any leaves, snow, or ice cleared regularly. Use sand or salt on walking paths during winter. Clean up any spills in your garage right away. This includes oil or grease spills. What can I do in the bathroom? Use night lights. Install grab bars by the toilet and in the tub and shower. Do not use towel bars as grab bars. Use non-skid mats or decals in the tub or shower. If you need to sit down in the shower, use a plastic, non-slip stool. Keep the floor dry. Clean up any water that spills on the floor as soon as it happens. Remove soap buildup in the tub or shower  regularly. Attach bath mats securely with double-sided non-slip rug tape. Do not have throw rugs and other things on the floor that can make you trip. What can I do in the bedroom? Use night lights. Make sure that you have a light by your bed that is easy to reach. Do not use any sheets or blankets that are too big for your bed. They should not hang down onto the floor. Have a firm chair that has side arms. You can use this for support while you get dressed. Do not have throw rugs and other things on the floor that can make you trip. What can I do in the kitchen? Clean up any spills right away. Avoid walking on wet floors. Keep items that you use a lot in easy-to-reach places. If you need to reach something above you, use a strong step stool that has a grab bar. Keep electrical cords out of the way. Do not use floor polish or wax that makes floors slippery. If you must use wax, use non-skid floor wax. Do not have throw rugs and other things on the floor that can make you trip. What can I do with my stairs? Do not leave any items on the stairs. Make sure that there are handrails on both sides of the stairs and use them. Fix handrails that are broken or loose. Make sure that handrails are as long as the stairways. Check any carpeting to make sure that it is firmly  attached to the stairs. Fix any carpet that is loose or worn. Avoid having throw rugs at the top or bottom of the stairs. If you do have throw rugs, attach them to the floor with carpet tape. Make sure that you have a light switch at the top of the stairs and the bottom of the stairs. If you do not have them, ask someone to add them for you. What else can I do to help prevent falls? Wear shoes that: Do not have high heels. Have rubber bottoms. Are comfortable and fit you well. Are closed at the toe. Do not wear sandals. If you use a stepladder: Make sure that it is fully opened. Do not climb a closed stepladder. Make sure that  both sides of the stepladder are locked into place. Ask someone to hold it for you, if possible. Clearly mark and make sure that you can see: Any grab bars or handrails. First and last steps. Where the edge of each step is. Use tools that help you move around (mobility aids) if they are needed. These include: Canes. Walkers. Scooters. Crutches. Turn on the lights when you go into a dark area. Replace any light bulbs as soon as they burn out. Set up your furniture so you have a clear path. Avoid moving your furniture around. If any of your floors are uneven, fix them. If there are any pets around you, be aware of where they are. Review your medicines with your doctor. Some medicines can make you feel dizzy. This can increase your chance of falling. Ask your doctor what other things that you can do to help prevent falls. This information is not intended to replace advice given to you by your health care provider. Make sure you discuss any questions you have with your health care provider. Document Released: 12/16/2008 Document Revised: 07/28/2015 Document Reviewed: 03/26/2014 Elsevier Interactive Patient Education  2017 [employer].

## 2021-02-15 NOTE — Progress Notes (Signed)
Rich Brave [place],acting as a Education administrator for Maximino Greenland, MD.,have documented all relevant documentation on the behalf of Maximino Greenland, MD,as directed by  Maximino Greenland, MD while in the presence of Maximino Greenland, MD.  This visit occurred during the SARS-CoV-2 public health emergency.  Safety protocols were in place, including screening questions prior to the visit, additional usage of staff PPE, and extensive cleaning of exam room while observing appropriate contact time as indicated for disinfecting solutions.  Subjective:     Patient ID: Amy Serrano , female    DOB: [birth year] , 75 y.o.   MRN: [id number]   Chief Complaint  Patient presents with   ER F/U   Hypertension    HPI  Patient presents today for a ER f/u. She presented to ER on 02/06/21 c/o hives and facial swelling. Admits eating cookies with macademia nuts. She denies prior allergy to this. She was diagnosed with anaphylaxis and prescribed an Epi-pen. She is now feeling much better. She would like a referral to see an allergist. She also presents today for a bp check.   She is also scheduled for AWV with [facility] Advisor.   Hypertension This is a chronic problem. The current episode started more than 1 year ago. The problem has been gradually improving since onset. The problem is controlled. Pertinent negatives include no blurred vision, chest pain, palpitations or shortness of breath. Risk factors for coronary artery disease include obesity, post-menopausal state and sedentary lifestyle. The current treatment provides moderate improvement. Hypertensive end-organ damage includes kidney disease.    Past Medical History:  Diagnosis Date   Endometriosis    Hyperlipidemia    Hypertension      Family History  Problem Relation Age of Onset   Hypertension Other    Stroke Other    Hypertension Mother    Heart attack Father      Current Outpatient Medications:    amLODipine (NORVASC) 10 MG tablet, TAKE 1 TABLET BY  MOUTH DAILY, Disp: 90 tablet, Rfl: 1   aspirin 81 MG tablet, Take 81 mg by mouth at bedtime. , Disp: , Rfl:    cetirizine (ZYRTEC ALLERGY) 10 MG tablet, Take 1 tablet (10 mg total) by mouth daily. prn, Disp: 30 tablet, Rfl: 2   Cholecalciferol (VITAMIN D3) 125 MCG (5000 UT) CAPS, Take 1 capsule by mouth daily., Disp: , Rfl:    EPINEPHrine 0.3 mg/0.3 mL IJ SOAJ injection, Inject 0.3 mg into the muscle as needed for anaphylaxis., Disp: 1 each, Rfl: 0   hydrochlorothiazide (HYDRODIURIL) 12.5 MG tablet, TAKE 1 TABLET(12.5 MG) BY MOUTH 3 TIMES A WEEK, Disp: 30 tablet, Rfl: 1   Multiple Vitamin (MULTIVITAMIN) tablet, Take 1 tablet by mouth daily., Disp: , Rfl:    Omega-3 Fatty Acids (OMEGA 3 PO), Take 1 tablet by mouth daily at 12 noon., Disp: , Rfl:    pravastatin (PRAVACHOL) 40 MG tablet, TAKE 1 TABLET(40 MG) BY MOUTH EVERY EVENING (Patient not taking: Reported on 02/15/2021), Disp: 90 tablet, Rfl: 1   Allergies  Allergen Reactions   Benazepril Swelling    Swelling of the face    Ace Inhibitors     Angioedema     Review of Systems  Constitutional: Negative.   Eyes:  Negative for blurred vision.  Respiratory: Negative.  Negative for shortness of breath.   Cardiovascular: Negative.  Negative for chest pain and palpitations.  Gastrointestinal: Negative.   Neurological: Negative.   Psychiatric/Behavioral: Negative.  Today's Vitals   02/15/21 0906  BP: 114/80  Pulse: 67  Temp: 98.8 F (37.1 C)  Weight: 202 lb 9.6 oz (91.9 kg)  Height: 5\' 5"  (1.651 m)  PainSc: 0-No pain   Body mass index is 33.71 kg/m.  Wt Readings from Last 3 Encounters:  02/15/21 202 lb (91.6 kg)  02/15/21 202 lb 9.6 oz (91.9 kg)  08/23/20 203 lb 3.2 oz (92.2 kg)     Objective:  Physical Exam Vitals and nursing note reviewed.  Constitutional:      Appearance: Normal appearance. She is obese.  HENT:     Head: Normocephalic and atraumatic.     Nose:     Comments: Masked     Mouth/Throat:     Comments:  Masked  Eyes:     Extraocular Movements: Extraocular movements intact.  Cardiovascular:     Rate and Rhythm: Normal rate and regular rhythm.     Heart sounds: Normal heart sounds.  Pulmonary:     Effort: Pulmonary effort is normal.     Breath sounds: Normal breath sounds.  Musculoskeletal:     Cervical back: Normal range of motion.  Skin:    General: Skin is warm.  Neurological:     General: No focal deficit present.     Mental Status: She is alert.  Psychiatric:        Mood and Affect: Mood normal.        Behavior: Behavior normal.        Assessment And Plan:     1. Anaphylaxis, subsequent encounter Comments: ER records from 12/5 reviewed. I will check labs as below and refer to Allergist.  - Ambulatory referral to Allergy - ANA, IFA (with reflex) - Liver Profile  2. Hypertensive nephropathy Comments: Chronic, well controlled. Encouraged to follow low sodium diet. Renal function from ER records reviewed, she is encouraged to stay well hydrated.   3. Renal insufficiency Comments: I will check labs as below. Encouraged to stay well hydrated and keep BP under optimal control.  - Protein electrophoresis, serum  4. Other abnormal glucose Comments: Her a1c has been elevated in the past. I will recheck this today. Encouraged to limit her intake of sweetened beverages and desserts.  - Hemoglobin A1c  5. Class 1 obesity due to excess calories with serious comorbidity and body mass index (BMI) of 33.0 to 33.9 in adult  She is encouraged to strive for BMI less than 30 to decrease cardiac risk. Advised to aim for at least 150 minutes of exercise per week.   Patient was given opportunity to ask questions. Patient verbalized understanding of the plan and was able to repeat key elements of the plan. All questions were answered to their satisfaction.   I, Maximino Greenland, MD, have reviewed all documentation for this visit. The documentation on 02/15/21 for the exam, diagnosis,  procedures, and orders are all accurate and complete.   IF YOU HAVE BEEN REFERRED TO A SPECIALIST, IT MAY TAKE 1-2 WEEKS TO SCHEDULE/PROCESS THE REFERRAL. IF YOU HAVE NOT HEARD FROM US/SPECIALIST IN TWO WEEKS, PLEASE GIVE [place] A CALL AT [phone] X [phone].   THE PATIENT IS ENCOURAGED TO PRACTICE SOCIAL DISTANCING DUE TO THE COVID-19 PANDEMIC.

## 2021-02-15 NOTE — Patient Instructions (Signed)

## 2021-02-15 NOTE — Progress Notes (Signed)
This visit occurred during the SARS-CoV-2 public health emergency.  Safety protocols were in place, including screening questions prior to the visit, additional usage of staff PPE, and extensive cleaning of exam room while observing appropriate contact time as indicated for disinfecting solutions.  Subjective:   Amy Serrano is a 75 y.o. female who presents for Medicare Annual (Subsequent) preventive examination.  Review of Systems     Cardiac Risk Factors include: advanced age (>55men, >70 women);hypertension;obesity (BMI >30kg/m2)     Objective:    Today's Vitals   02/15/21 0930  BP: 114/80  Pulse: 67  Temp: 98.8 F (37.1 C)  TempSrc: Oral  Weight: 202 lb (91.6 kg)  Height: 5\' 5"  (1.651 m)   Body mass index is 33.61 kg/m.  Advanced Directives 02/15/2021 02/11/2020 11/26/2018 01/01/2018 08/05/2015 08/07/2014 08/01/2014  Does Patient Have a Medical Advance Directive? No No No Yes No No No  Type of Advance Directive - - - [place]  Does patient want to make changes to medical advance directive? - - - No - Patient declined - - -  Copy of [place] in Chart? - - - No - copy requested - - -  Would patient like information on creating a medical advance directive? No - Patient declined No - Patient declined - - No - patient declined information No - patient declined information No - patient declined information    Current Medications (verified) Outpatient Encounter Medications as of 02/15/2021  Medication Sig   amLODipine (NORVASC) 10 MG tablet TAKE 1 TABLET BY MOUTH DAILY   aspirin 81 MG tablet Take 81 mg by mouth at bedtime.    Cholecalciferol (VITAMIN D3) 125 MCG (5000 UT) CAPS Take 1 capsule by mouth daily.   EPINEPHrine 0.3 mg/0.3 mL IJ SOAJ injection Inject 0.3 mg into the muscle as needed for anaphylaxis.   hydrochlorothiazide (HYDRODIURIL) 12.5 MG tablet TAKE 1 TABLET(12.5 MG) BY MOUTH 3 TIMES A WEEK   Multiple Vitamin  (MULTIVITAMIN) tablet Take 1 tablet by mouth daily.   Omega-3 Fatty Acids (OMEGA 3 PO) Take 1 tablet by mouth daily at 12 noon.   pravastatin (PRAVACHOL) 40 MG tablet TAKE 1 TABLET(40 MG) BY MOUTH EVERY EVENING (Patient not taking: Reported on 02/15/2021)   [DISCONTINUED] amLODipine-benazepril (LOTREL) 10-20 MG per capsule Take 1 capsule by mouth daily.   [DISCONTINUED] dicyclomine (BENTYL) 10 MG capsule Take 2 capsules (20 mg total) by mouth 3 (three) times daily between meals as needed for spasms. (Patient not taking: Reported on 08/05/2015)   [DISCONTINUED] famotidine (PEPCID) 20 MG tablet Take 1 tablet (20 mg total) by mouth 2 (two) times daily. (Patient not taking: Reported on 08/05/2015)   [DISCONTINUED] metoCLOPramide (REGLAN) 10 MG tablet Take 1 tablet (10 mg total) by mouth every 6 (six) hours as needed for nausea (nausea/headache). (Patient not taking: Reported on 08/05/2015)   No facility-administered encounter medications on file as of 02/15/2021.    Allergies (verified) Benazepril and Ace inhibitors   History: Past Medical History:  Diagnosis Date   Endometriosis    Hyperlipidemia    Hypertension    Past Surgical History:  Procedure Laterality Date   arm surgery     left   Family History  Problem Relation Age of Onset   Hypertension Other    Stroke Other    Hypertension Mother    Heart attack Father    Social History   Socioeconomic History   Marital status: Widowed  Spouse name: Not on file   Number of children: Not on file   Years of education: Not on file   Highest education level: Not on file  Occupational History   Occupation: retired  Tobacco Use   Smoking status: Never   Smokeless tobacco: Never  Vaping Use   Vaping Use: Never used  Substance and Sexual Activity   Alcohol use: No   Drug use: No   Sexual activity: Not Currently  Other Topics Concern   Not on file  Social History Narrative   Not on file   Social Determinants of Health    Financial Resource Strain: Low Risk    Difficulty of Paying Living Expenses: Not hard at all  Food Insecurity: No Food Insecurity   Worried About Charity fundraiser in the Last Year: Never true   [place] in the Last Year: Never true  Transportation Needs: No Transportation Needs   Lack of Transportation (Medical): No   Lack of Transportation (Non-Medical): No  Physical Activity: Insufficiently Active   Days of Exercise per Week: 7 days   Minutes of Exercise per Session: 20 min  Stress: No Stress Concern Present   Feeling of Stress : Not at all  Social Connections: Not on file    Tobacco Counseling Counseling given: Not Answered   Clinical Intake:  Pre-visit preparation completed: Yes  Pain : No/denies pain     Nutritional Status: BMI > 30  Obese Nutritional Risks: None Diabetes: No  How often do you need to have someone help you when you read instructions, pamphlets, or other written materials from your doctor or pharmacy?: 1 - Never What is the last grade level you completed in school?: 40yr college  Diabetic? no  Interpreter Needed?: No  Information entered by :: NAllen LPN   Activities of Daily Living In your present state of health, do you have any difficulty performing the following activities: 02/15/2021 02/15/2021  Hearing? N N  Vision? N N  Difficulty concentrating or making decisions? N N  Walking or climbing stairs? N N  Dressing or bathing? N N  Doing errands, shopping? N N  Preparing Food and eating ? N -  Using the Toilet? N -  In the past six months, have you accidently leaked urine? Y -  Comment with laughing -  Do you have problems with loss of bowel control? N -  Managing your Medications? N -  Managing your Finances? N -  Housekeeping or managing your Housekeeping? N -  Some recent data might be hidden    Patient Care Team: Glendale Chard, MD as PCP - General (Internal Medicine) Marylynn Pearson, MD as Consulting Physician  (Ophthalmology)  Indicate any recent Medical Services you may have received from other than [facility] providers in the past year (date may be approximate).     Assessment:   This is a routine wellness examination for [facility].  Hearing/Vision screen Vision Screening - Comments:: Regular eye exams, Dr. Venetia Maxon  Dietary issues and exercise activities discussed: Current Exercise Habits: Home exercise routine, Type of exercise: walking, Time (Minutes): 20, Frequency (Times/Week): 7, Weekly Exercise (Minutes/Week): 140   Goals Addressed             This Visit's Progress    Patient Stated       02/15/2021, stay healthy       Depression Screen PHQ 2/9 Scores 02/15/2021 02/15/2021 02/11/2020 07/29/2019 11/26/2018 06/19/2018 01/23/2018  PHQ - 2 Score 0 0 0  0 0 0 0  PHQ- 9 Score - - - - 0 - -    Fall Risk Fall Risk  02/15/2021 02/15/2021 02/11/2020 07/29/2019 01/27/2019  Falls in the past year? 0 0 0 0 0  Number falls in past yr: - 0 - 0 -  Injury with Fall? - 0 - 0 -  Risk for fall due to : Medication side effect - Medication side effect - -  Follow up Falls evaluation completed;Education provided;Falls prevention discussed - Falls evaluation completed;Education provided;Falls prevention discussed - -    FALL RISK PREVENTION PERTAINING TO THE HOME:  Any stairs in or around the home? Yes  If so, are there any without handrails? no Home free of loose throw rugs in walkways, pet beds, electrical cords, etc? Yes  Adequate lighting in your home to reduce risk of falls? Yes   ASSISTIVE DEVICES UTILIZED TO PREVENT FALLS:  Life alert? No  Use of a cane, walker or w/c? No  Grab bars in the bathroom? No  Shower chair or bench in shower? No  Elevated toilet seat or a handicapped toilet? No   TIMED UP AND GO:  Was the test performed? No .    Gait steady and fast without use of assistive device  Cognitive Function:     6CIT Screen 02/15/2021 02/11/2020 11/26/2018 01/01/2018  What Year?  0 points 0 points 0 points 0 points  What month? 0 points 0 points 0 points 0 points  What time? 3 points 0 points 0 points 0 points  Count back from 20 0 points 0 points 0 points 0 points  Months in reverse 2 points 0 points 0 points 2 points  Repeat phrase 4 points 4 points 2 points 0 points  Total Score 9 4 2 2     Immunizations Immunization History  Administered Date(s) Administered   Fluad Quad(high Dose 65+) 02/11/2020   Influenza, High Dose Seasonal PF 12/02/2017, 11/26/2018   Influenza-Unspecified 12/03/2013, 11/28/2017, 01/23/2021   PFIZER(Purple Top)SARS-COV-2 Vaccination 04/20/2019, 05/12/2019, 12/07/2019, 12/16/2020    TDAP status: Due, Education has been provided regarding the importance of this vaccine. Advised may receive this vaccine at local pharmacy or Health Dept. Aware to provide a copy of the vaccination record if obtained from local pharmacy or Health Dept. Verbalized acceptance and understanding.  Flu Vaccine status: Up to date  Pneumococcal vaccine status: Declined,  Education has been provided regarding the importance of this vaccine but patient still declined. Advised may receive this vaccine at local pharmacy or Health Dept. Aware to provide a copy of the vaccination record if obtained from local pharmacy or Health Dept. Verbalized acceptance and understanding.   Covid-19 vaccine status: Completed vaccines  Qualifies for Shingles Vaccine? Yes   Zostavax completed No   Shingrix Completed?: No.    Education has been provided regarding the importance of this vaccine. Patient has been advised to call insurance company to determine out of pocket expense if they have not yet received this vaccine. Advised may also receive vaccine at local pharmacy or Health Dept. Verbalized acceptance and understanding.  Screening Tests Health Maintenance  Topic Date Due   TETANUS/TDAP  Never done   COVID-19 Vaccine (5 - Booster for Pfizer series) 02/10/2021   Zoster Vaccines-  Shingrix (1 of 2) 05/16/2021 (Originally 08/10/1995)   Pneumonia Vaccine 56+ Years old (1 - PCV) 02/15/2022 (Originally 08/10/1951)   INFLUENZA VACCINE  Completed   DEXA SCAN  Completed   Hepatitis C Screening  Completed  HPV VACCINES  Aged Out   COLONOSCOPY (Pts 45-11yrs Insurance coverage will need to be confirmed)  Discontinued    Health Maintenance  Health Maintenance Due  Topic Date Due   TETANUS/TDAP  Never done   COVID-19 Vaccine (5 - Booster for Pfizer series) 02/10/2021    Colorectal cancer screening: No longer required.   Mammogram status: patient to schedule  Bone Density status: Completed 12/16/2018.   Lung Cancer Screening: (Low Dose CT Chest recommended if Age 42-80 years, 30 pack-year currently smoking OR have quit w/in 15years.) does not qualify.   Lung Cancer Screening Referral: no  Additional Screening:  Hepatitis C Screening: does qualify; Completed 06/19/2018  Vision Screening: Recommended annual ophthalmology exams for early detection of glaucoma and other disorders of the eye. Is the patient up to date with their annual eye exam?  Yes  Who is the provider or what is the name of the office in which the patient attends annual eye exams? Dr. Venetia Maxon If pt is not established with a provider, would they like to be referred to a provider to establish care? No .   Dental Screening: Recommended annual dental exams for proper oral hygiene  Community Resource Referral / Chronic Care Management: CRR required this visit?  No   CCM required this visit?  No      Plan:     I have personally reviewed and noted the following in the patients chart:   Medical and social history Use of alcohol, tobacco or illicit drugs  Current medications and supplements including opioid prescriptions.  Functional ability and status Nutritional status Physical activity Advanced directives List of other physicians Hospitalizations, surgeries, and ER visits in previous 12  months Vitals Screenings to include cognitive, depression, and falls Referrals and appointments  In addition, I have reviewed and discussed with patient certain preventive protocols, quality metrics, and best practice recommendations. A written personalized care plan for preventive services as well as general preventive health recommendations were provided to patient.     Kellie Simmering, LPN   [id number]   Nurse Notes: none

## 2021-02-15 NOTE — Addendum Note (Signed)
Addended by: Glenna Durand E on: 02/15/2021 03:40 PM   Modules accepted: Orders

## 2021-02-20 ENCOUNTER — Encounter: Payer: Medicare PPO | Admitting: Internal Medicine

## 2021-02-20 LAB — PROTEIN ELECTROPHORESIS, SERUM
A/G Ratio: 1 (ref 0.7–1.7)
Albumin ELP: 3.6 g/dL (ref 2.9–4.4)
Alpha 1: 0.2 g/dL (ref 0.0–0.4)
Alpha 2: 1.1 g/dL — ABNORMAL HIGH (ref 0.4–1.0)
Beta: 1.2 g/dL (ref 0.7–1.3)
Gamma Globulin: 1 g/dL (ref 0.4–1.8)
Globulin, Total: 3.5 g/dL (ref 2.2–3.9)
M-Spike, %: 0.5 g/dL — ABNORMAL HIGH
Total Protein: 7.1 g/dL (ref 6.0–8.5)

## 2021-02-20 LAB — HEPATIC FUNCTION PANEL
ALT: 19 IU/L (ref 0–32)
AST: 18 IU/L (ref 0–40)
Albumin: 4.2 g/dL (ref 3.7–4.7)
Alkaline Phosphatase: 79 IU/L (ref 44–121)
Bilirubin Total: 0.3 mg/dL (ref 0.0–1.2)
Bilirubin, Direct: 0.11 mg/dL (ref 0.00–0.40)

## 2021-02-20 LAB — ANTINUCLEAR ANTIBODIES, IFA: ANA Titer 1: NEGATIVE

## 2021-02-20 LAB — HEMOGLOBIN A1C
Est. average glucose Bld gHb Est-mCnc: 140 mg/dL
Hgb A1c MFr Bld: 6.5 % — ABNORMAL HIGH (ref 4.8–5.6)

## 2021-02-22 ENCOUNTER — Ambulatory Visit (INDEPENDENT_AMBULATORY_CARE_PROVIDER_SITE_OTHER): Payer: Medicare PPO | Admitting: Internal Medicine

## 2021-02-22 ENCOUNTER — Other Ambulatory Visit: Payer: Self-pay

## 2021-02-22 ENCOUNTER — Encounter: Payer: Self-pay | Admitting: Internal Medicine

## 2021-02-22 VITALS — BP 122/70 | HR 99 | Temp 97.9°F | Resp 20 | Ht 66.0 in | Wt 209.6 lb

## 2021-02-22 DIAGNOSIS — L500 Allergic urticaria: Secondary | ICD-10-CM | POA: Diagnosis not present

## 2021-02-22 DIAGNOSIS — T782XXA Anaphylactic shock, unspecified, initial encounter: Secondary | ICD-10-CM | POA: Insufficient documentation

## 2021-02-22 DIAGNOSIS — T7800XA Anaphylactic reaction due to unspecified food, initial encounter: Secondary | ICD-10-CM | POA: Diagnosis not present

## 2021-02-22 NOTE — Patient Instructions (Signed)
History of Allergic Reaction:  - it is too soon to do skin testing - labs today: nut panel, tryptase level (checks your mast cells to make sure you have a normal level), and alpha-gal (mammalian meat allergy) - strictly avoid tree nuts for now including macadamia - if negative, we will have you come in for skin testing once 6 weeks have passed from your original reaction - if another reaction occurs, please use your epipen and call 911; let us know and write down all things that you ate or did that day prior to the reaction  We will call you with results once they are available to discuss next steps!  It was a pleasure meeting you in clinic today! If you receive a survey about today's experience, please consider giving [place] feedback on how we're doing!  Sigurd Sos, MD [facility]

## 2021-02-22 NOTE — Progress Notes (Signed)
NEW PATIENT Date of Service/Encounter:  02/22/21 Referring provider: Glendale Chard, MD Primary care provider: Glendale Chard, MD  Subjective:  Amy Serrano is a 75 y.o. female with a PMHx of hypertensive nephropathy, renal insufficiency,  presenting today for evaluation of history of anaphylaxis. History obtained from: chart review and patient.   She has eaten macadamia before and had no problem. However, on 02/05/21, she ate some store bough macadamia cookies and noted about 5 hours later, she had hives covering her extremities.  It got progressively worse to full body hives.  She took an epsom salt bath which helped relieved the symptoms.  Symptoms continued to worsen. She waited until the next day to call her PCP.  She realized next day that her face was swollen.   Tongue started to swell and her speech pattern changed.  Felt a little lightheaded "but not much".  Within 2 minutes, she was given epinephrine.  Also given steroids to calm down the hives.  Symptoms resolved with this medication.  On review of her day, had bacon and eggs with cheese and coffee cream and sugar for breakfast.  Took metamucil.  Thinks she had baked chicken with basil, thyme, garlic for lunch, dusted with flour and baked with mac and cheese and collard greens.  Later had the macadamia cookies and coffee-around 5:30 pm.  Symptoms noted at 10:30 pm.  She is sure she was not stung by an insect.  No cold symptoms or illness.  Got in her 700 steps and felt great that day.   She has eaten bacon, eggs, coffee, cheese, chicken since that day without symptoms.  She was given an Epipen.  Has not had any additional symptoms.  ED visit on 02/06/21: generalized itchiness and rash with tongue sewlling after eating macadamia nuts; treated with IM epinephrine, decadron and IV fluids  Other allergy screening: Asthma: no Rhino conjunctivitis: no Food allergy: no Medication allergy: yes-benazepril, ACEI  (angioedema) Hymenoptera allergy: no Urticaria: no Eczema:no  Past Medical History: Past Medical History:  Diagnosis Date   Endometriosis    Hyperlipidemia    Hypertension    Medication List:  Current Outpatient Medications  Medication Sig Dispense Refill   amLODipine (NORVASC) 10 MG tablet TAKE 1 TABLET BY MOUTH DAILY 90 tablet 1   aspirin 81 MG tablet Take 81 mg by mouth at bedtime.      Cholecalciferol (VITAMIN D3) 125 MCG (5000 UT) CAPS Take 1 capsule by mouth daily.     EPINEPHrine 0.3 mg/0.3 mL IJ SOAJ injection Inject 0.3 mg into the muscle as needed for anaphylaxis. 1 each 0   hydrochlorothiazide (HYDRODIURIL) 12.5 MG tablet TAKE 1 TABLET(12.5 MG) BY MOUTH 3 TIMES A WEEK 30 tablet 1   Multiple Vitamin (MULTIVITAMIN) tablet Take 1 tablet by mouth daily.     Omega-3 Fatty Acids (OMEGA 3 PO) Take 1 tablet by mouth daily at 12 noon.     cetirizine (ZYRTEC ALLERGY) 10 MG tablet Take 1 tablet (10 mg total) by mouth daily. prn (Patient not taking: Reported on 02/22/2021) 30 tablet 2   pravastatin (PRAVACHOL) 40 MG tablet TAKE 1 TABLET(40 MG) BY MOUTH EVERY EVENING (Patient not taking: Reported on 02/15/2021) 90 tablet 1   No current facility-administered medications for this visit.   Known Allergies:  Allergies  Allergen Reactions   Benazepril Swelling    Swelling of the face    Ace Inhibitors     Angioedema   Past Surgical History: Past Surgical History:  Procedure Laterality Date   arm surgery     left   Family History: Family History  Problem Relation Age of Onset   Hypertension Mother    Heart attack Father    Asthma Brother    Hypertension Other    Stroke Other    Social History: Olu[place]ara lives at home alone, pet dog, her husband passed away with cancer, she does lots of crafting.   ROS:  All other systems negative except as noted per HPI.  Objective:  Blood pressure 122/70, pulse 99, temperature 97.9 F (36.6 C), temperature source Temporal, resp. rate  20, height _0  (1.676 m), weight 209 lb 9.6 oz (95.1 kg), SpO2 99 %. Body mass index is 33.83 kg/m. Physical Exam:  General Appearance:  Alert, cooperative, no distress, appears stated age  Head:  Normocephalic, without obvious abnormality, atraumatic  Eyes:  Conjunctiva clear, EOM's intact  Nose: Nares normal, normal mucosa, no visible anterior polyps, and septum midline  Throat: Lips, tongue normal; teeth and gums normal, normal posterior oropharynx  Neck: Supple, symmetrical  Lungs:   clear to auscultation bilaterally, Respirations unlabored, no coughing  Heart:  regular rate and rhythm and no murmur, Appears well perfused  Extremities: No edema  Skin: Skin color, texture, turgor normal, no rashes or lesions on visualized portions of skin  Neurologic: No gross deficits     Diagnostics: Skin Testing:  Deferred-reaction too recent . Blood test obtained instead, if negative, will bring in for skin testing. Assessment and Plan   Patient Instructions  History of Allergic Reaction:  - it is too soon to do skin testing - labs today: nut panel, tryptase level (checks your mast cells to make sure you have a normal level), and alpha-gal (mammalian meat allergy) - strictly avoid tree nuts for now including macadamia - if negative, we will have you come in for skin testing once 6 weeks have passed from your original reaction - if another reaction occurs, please use your epipen and call 911; let us know and write down all things that you ate or did that day prior to the reaction  We will call you with results once they are available to discuss next steps!  It was a pleasure meeting you in clinic today! If you receive a survey about today's experience, please consider giving [place] feedback on how we're doing!  Sigurd Sos, MD [facility]   This note in its entirety was forwarded to the Provider who requested this consultation.  Thank you for your kind referral. I  appreciate the opportunity to take part in Keneisha's care. Please do not hesitate to contact me with questions.  Sincerely,  Sigurd Sos, MD [facility] [place] [facility] [place]

## 2021-02-25 LAB — ALLERGEN MACADAMIA NUT F345: Macadamia Nut, IgE: 0.67 kU/L — AB

## 2021-02-28 LAB — IGE NUT PROF. W/COMPONENT RFLX
F017-IgE Hazelnut (Filbert): 0.63 kU/L — AB
F018-IgE Brazil Nut: 0.21 kU/L — AB
F020-IgE Almond: 0.66 kU/L — AB
F202-IgE Cashew Nut: <0.1 kU/L
F203-IgE Pistachio Nut: 0.74 kU/L — AB
F256-IgE Walnut: 0.66 kU/L — AB
Macadamia Nut, IgE: 0.68 kU/L — AB
Peanut, IgE: 0.63 kU/L — AB
Pecan Nut IgE: 0.28 kU/L — AB

## 2021-02-28 LAB — PANEL 604726
Cor A 1 IgE: <0.1 kU/L
Cor A 14 IgE: <0.1 kU/L
Cor A 8 IgE: <0.1 kU/L
Cor A 9 IgE: 0.38 kU/L — AB

## 2021-02-28 LAB — PEANUT COMPONENTS
F352-IgE Ara h 8: <0.1 kU/L
F422-IgE Ara h 1: <0.1 kU/L
F423-IgE Ara h 2: <0.1 kU/L
F424-IgE Ara h 3: <0.1 kU/L
F427-IgE Ara h 9: <0.1 kU/L
F447-IgE Ara h 6: <0.1 kU/L

## 2021-02-28 LAB — ALPHA-GAL PANEL
Allergen Lamb IgE: <0.1 kU/L
Beef IgE: 0.11 kU/L — AB
IgE (Immunoglobulin E), Serum: 127 IU/mL (ref 6–495)
O215-IgE Alpha-Gal: 0.22 kU/L — AB
Pork IgE: <0.1 kU/L

## 2021-02-28 LAB — PANEL 604350: Ber E 1 IgE: <0.1 kU/L

## 2021-02-28 LAB — ALLERGEN COMPONENT COMMENTS

## 2021-02-28 LAB — TRYPTASE: Tryptase: 6.1 ug/L (ref 2.2–13.2)

## 2021-02-28 LAB — PANEL 604721
Jug R 1 IgE: <0.1 kU/L
Jug R 3 IgE: <0.1 kU/L

## 2021-03-01 NOTE — Progress Notes (Signed)
Please let Amy Serrano know that her tree nut panel and peanut has returned positive including to [place].    Her alpha-gal panel (the mammalian meat panel) was negative. She can continue to eat these meats as desired.  Her tryptase level was also normal (marker for mast cell disease).  She should continue to strictly avoid tree nuts and peanuts at this time.  Her peanut was borderline.  Has she had peanuts or peanut-containing foods since the reaction? If so, she may continue to eat these, if not, she should strictly avoid. The macadamia was likely the cause of her reaction.    Please let me know if you have any questions.  We will need to see you yearly to prescribe epinephrine autoinjector or you can have your primary care provider do so if you would prefer.

## 2021-03-06 ENCOUNTER — Other Ambulatory Visit: Payer: Self-pay | Admitting: Internal Medicine

## 2021-04-02 ENCOUNTER — Other Ambulatory Visit: Payer: Self-pay | Admitting: Internal Medicine

## 2021-05-25 DIAGNOSIS — H40011 Open angle with borderline findings, low risk, right eye: Secondary | ICD-10-CM | POA: Diagnosis not present

## 2021-05-25 DIAGNOSIS — H43813 Vitreous degeneration, bilateral: Secondary | ICD-10-CM | POA: Diagnosis not present

## 2021-05-25 DIAGNOSIS — Z961 Presence of intraocular lens: Secondary | ICD-10-CM | POA: Diagnosis not present

## 2021-06-14 ENCOUNTER — Encounter: Payer: Medicare PPO | Admitting: Internal Medicine

## 2021-07-25 ENCOUNTER — Other Ambulatory Visit: Payer: Self-pay | Admitting: Internal Medicine

## 2021-07-25 DIAGNOSIS — Z1231 Encounter for screening mammogram for malignant neoplasm of breast: Secondary | ICD-10-CM

## 2021-08-01 ENCOUNTER — Ambulatory Visit (INDEPENDENT_AMBULATORY_CARE_PROVIDER_SITE_OTHER): Payer: Medicare PPO | Admitting: Internal Medicine

## 2021-08-01 ENCOUNTER — Encounter: Payer: Self-pay | Admitting: Internal Medicine

## 2021-08-01 VITALS — BP 120/76 | HR 77 | Temp 98.1°F | Ht 64.0 in | Wt 198.2 lb

## 2021-08-01 DIAGNOSIS — Z6834 Body mass index (BMI) 34.0-34.9, adult: Secondary | ICD-10-CM

## 2021-08-01 DIAGNOSIS — E1122 Type 2 diabetes mellitus with diabetic chronic kidney disease: Secondary | ICD-10-CM | POA: Diagnosis not present

## 2021-08-01 DIAGNOSIS — E1169 Type 2 diabetes mellitus with other specified complication: Secondary | ICD-10-CM

## 2021-08-01 DIAGNOSIS — Z0000 Encounter for general adult medical examination without abnormal findings: Secondary | ICD-10-CM

## 2021-08-01 DIAGNOSIS — N1831 Chronic kidney disease, stage 3a: Secondary | ICD-10-CM

## 2021-08-01 DIAGNOSIS — E6609 Other obesity due to excess calories: Secondary | ICD-10-CM | POA: Diagnosis not present

## 2021-08-01 DIAGNOSIS — E119 Type 2 diabetes mellitus without complications: Secondary | ICD-10-CM

## 2021-08-01 DIAGNOSIS — I129 Hypertensive chronic kidney disease with stage 1 through stage 4 chronic kidney disease, or unspecified chronic kidney disease: Secondary | ICD-10-CM | POA: Diagnosis not present

## 2021-08-01 DIAGNOSIS — N189 Chronic kidney disease, unspecified: Secondary | ICD-10-CM | POA: Diagnosis not present

## 2021-08-01 DIAGNOSIS — E66811 Obesity, class 1: Secondary | ICD-10-CM

## 2021-08-01 DIAGNOSIS — Z2821 Immunization not carried out because of patient refusal: Secondary | ICD-10-CM

## 2021-08-01 DIAGNOSIS — R778 Other specified abnormalities of plasma proteins: Secondary | ICD-10-CM | POA: Diagnosis not present

## 2021-08-01 DIAGNOSIS — E785 Hyperlipidemia, unspecified: Secondary | ICD-10-CM | POA: Diagnosis not present

## 2021-08-01 DIAGNOSIS — E7800 Pure hypercholesterolemia, unspecified: Secondary | ICD-10-CM

## 2021-08-01 LAB — POCT URINALYSIS DIPSTICK
Bilirubin, UA: NEGATIVE
Blood, UA: NEGATIVE
Glucose, UA: NEGATIVE
Ketones, UA: NEGATIVE
Leukocytes, UA: NEGATIVE
Nitrite, UA: NEGATIVE
Protein, UA: NEGATIVE
Spec Grav, UA: >=1.03 — AB (ref 1.010–1.025)
Urobilinogen, UA: 0.2 E.U./dL
pH, UA: 5.5 (ref 5.0–8.0)

## 2021-08-01 MED ORDER — HYDROCHLOROTHIAZIDE 12.5 MG PO TABS: ORAL_TABLET | ORAL | 1 refills | Status: DC

## 2021-08-01 MED ORDER — AMLODIPINE BESYLATE 10 MG PO TABS
10.0000 mg | ORAL_TABLET | Freq: Every day | ORAL | 1 refills | Status: DC
Start: 2021-08-01 — End: 2022-10-22

## 2021-08-01 NOTE — Patient Instructions (Signed)

## 2021-08-01 NOTE — Progress Notes (Addendum)
Rich Brave [place],acting as a Education administrator for Maximino Greenland, MD.,have documented all relevant documentation on the behalf of Maximino Greenland, MD,as directed by  Maximino Greenland, MD while in the presence of Maximino Greenland, MD.  This visit occurred during the SARS-CoV-2 public health emergency.  Safety protocols were in place, including screening questions prior to the visit, additional usage of staff PPE, and extensive cleaning of exam room while observing appropriate contact time as indicated for disinfecting solutions.  Subjective:     Patient ID: Amy Serrano , female    DOB: 01/19/1946 , 76 y.o.   MRN: [id number]   Chief Complaint  Patient presents with   Annual Exam   Hypertension    HPI  She is here today for a full physical exam. She is no longer followed by GYN.  She reports compliance with meds. She denies headaches, chest pain and shortness of breath. At her last visit, her a1c was 6.5. Pt is aware of diabetes diagnosis. She does not wish to take meds, prefers to control with diet/exercise.   Hypertension This is a chronic problem. The current episode started more than 1 year ago. The problem has been gradually improving since onset. The problem is uncontrolled. Pertinent negatives include no blurred vision. Past treatments include calcium channel blockers. The current treatment provides moderate improvement. Compliance problems include exercise.      Past Medical History:  Diagnosis Date   Endometriosis    Hyperlipidemia    Hypertension      Family History  Problem Relation Age of Onset   Hypertension Mother    Heart attack Father    Asthma Brother    Hypertension Other    Stroke Other      Current Outpatient Medications:    aspirin 81 MG tablet, Take 81 mg by mouth at bedtime. , Disp: , Rfl:    EPINEPHrine 0.3 mg/0.3 mL IJ SOAJ injection, Inject 0.3 mg into the muscle as needed for anaphylaxis., Disp: 1 each, Rfl: 0   Multiple Vitamin (MULTIVITAMIN) tablet,  Take 1 tablet by mouth daily., Disp: , Rfl:    Omega-3 Fatty Acids (OMEGA 3 PO), Take 1 tablet by mouth daily at 12 noon., Disp: , Rfl:    amLODipine (NORVASC) 10 MG tablet, Take 1 tablet (10 mg total) by mouth daily., Disp: 90 tablet, Rfl: 1   Cholecalciferol (VITAMIN D3) 125 MCG (5000 UT) CAPS, Take 1 capsule by mouth daily., Disp: , Rfl:    hydrochlorothiazide (HYDRODIURIL) 12.5 MG tablet, One tab po qd, Disp: 90 tablet, Rfl: 1   Allergies  Allergen Reactions   Benazepril Swelling    Swelling of the face    Justicia Adhatoda (Malabar Nut Tree) [Justicia Adhatoda] Anaphylaxis   Ace Inhibitors     Angioedema   Peanut Butter Flavor       The patient states she uses post menopausal status for birth control. Last LMP was No LMP recorded. Patient is postmenopausal.. Negative for Dysmenorrhea. Negative for: breast discharge, breast lump(s), breast pain and breast self exam. Associated symptoms include abnormal vaginal bleeding. Pertinent negatives include abnormal bleeding (hematology), anxiety, decreased libido, depression, difficulty falling sleep, dyspareunia, history of infertility, nocturia, sexual dysfunction, sleep disturbances, urinary incontinence, urinary urgency, vaginal discharge and vaginal itching. Diet regular.The patient states her exercise level is    . The patient's tobacco use is:  Social History   Tobacco Use  Smoking Status Never  Smokeless Tobacco Never  . She has been exposed  to passive smoke. The patient's alcohol use is:  Social History   Substance and Sexual Activity  Alcohol Use No   Review of Systems  Constitutional: Negative.   HENT: Negative.    Eyes: Negative.  Negative for blurred vision.  Respiratory: Negative.    Cardiovascular: Negative.   Gastrointestinal: Negative.   Endocrine: Negative.   Genitourinary: Negative.   Musculoskeletal: Negative.   Skin: Negative.   Allergic/Immunologic: Negative.   Neurological: Negative.   Hematological:  Negative.   Psychiatric/Behavioral: Negative.       Today's Vitals   08/01/21 1520  BP: 120/76  Pulse: 77  Temp: 98.1 F (36.7 C)  Weight: 198 lb 3.2 oz (89.9 kg)  Height: _0  (1.626 m)  PainSc: 0-No pain   Body mass index is 34.02 kg/m.  Wt Readings from Last 3 Encounters:  09/11/21 201 lb 6.4 oz (91.4 kg)  08/01/21 198 lb 3.2 oz (89.9 kg)  02/22/21 209 lb 9.6 oz (95.1 kg)     Objective:  Physical Exam Vitals and nursing note reviewed.  Constitutional:      Appearance: Normal appearance. She is obese.  HENT:     Head: Normocephalic and atraumatic.     Right Ear: Tympanic membrane, ear canal and external ear normal.     Left Ear: Tympanic membrane, ear canal and external ear normal.     Nose: Nose normal.     Mouth/Throat:     Mouth: Mucous membranes are moist.     Pharynx: Oropharynx is clear.  Eyes:     Extraocular Movements: Extraocular movements intact.     Conjunctiva/sclera: Conjunctivae normal.     Pupils: Pupils are equal, round, and reactive to light.  Cardiovascular:     Rate and Rhythm: Normal rate and regular rhythm.     Pulses: Normal pulses.          Dorsalis pedis pulses are 2+ on the right side and 2+ on the left side.     Heart sounds: Normal heart sounds.  Pulmonary:     Effort: Pulmonary effort is normal.     Breath sounds: Normal breath sounds.  Chest:  Breasts:    Tanner Score is 5.     Right: Normal.     Left: Normal.  Abdominal:     General: Bowel sounds are normal.     Palpations: Abdomen is soft.  Genitourinary:    Comments: deferred Musculoskeletal:        General: Normal range of motion.     Cervical back: Normal range of motion and neck supple.  Feet:     Right foot:     Protective Sensation: 5 sites tested.  5 sites sensed.     Skin integrity: Callus and dry skin present.     Toenail Condition: Right toenails are normal.     Left foot:     Protective Sensation: 5 sites tested.  5 sites sensed.     Skin integrity: Callus  and dry skin present.     Toenail Condition: Left toenails are normal.  Skin:    General: Skin is warm and dry.  Neurological:     General: No focal deficit present.     Mental Status: She is alert and oriented to person, place, and time.  Psychiatric:        Mood and Affect: Mood normal.        Behavior: Behavior normal.      Assessment And Plan:     1.  Encounter for general adult medical examination w/o abnormal findings Comments: A full exam was performed. Importance of monthly self breast exams was discussed with patient. PATIENT IS ADVISED TO GET 30-45 MINUTES REGULAR EXERCISE NO LESS THAN FOUR TO FIVE DAYS PER WEEK - BOTH WEIGHTBEARING EXERCISES AND AEROBIC ARE RECOMMENDED.  PATIENT IS ADVISED TO FOLLOW A HEALTHY DIET WITH AT LEAST SIX FRUITS/VEGGIES PER DAY, DECREASE INTAKE OF RED MEAT, AND TO INCREASE FISH INTAKE TO TWO DAYS PER WEEK.  MEATS/FISH SHOULD NOT BE FRIED, BAKED OR BROILED IS PREFERABLE.  IT IS ALSO IMPORTANT TO CUT BACK ON YOUR SUGAR INTAKE. PLEASE AVOID ANYTHING WITH ADDED SUGAR, CORN SYRUP OR OTHER SWEETENERS. IF YOU MUST USE A SWEETENER, YOU CAN TRY STEVIA. IT IS ALSO IMPORTANT TO AVOID ARTIFICIALLY SWEETENERS AND DIET BEVERAGES. LASTLY, I SUGGEST WEARING SPF 50 SUNSCREEN ON EXPOSED PARTS AND ESPECIALLY WHEN IN THE DIRECT SUNLIGHT FOR AN EXTENDED PERIOD OF TIME.  PLEASE AVOID FAST FOOD RESTAURANTS AND INCREASE YOUR WATER INTAKE. - CBC - CMP14+EGFR - Lipid panel - Hemoglobin A1c  2. Hypertensive nephropathy Comments: Chronic, well controlled. EKG performed, NSR w/o acute changes. She will c/w amlodipine. I plan to substitute HCTZ w/ low dose ARB for renal protection.  - POCT Urinalysis Dipstick (81002) - Microalbumin / Creatinine Urine Ratio - EKG 12-Lead  3. Dyslipidemia associated with type 2 diabetes mellitus ([place]) Comments: Last a1c was 6.5 in December 2022. LDL goal is <70 given new onset DM. She has been on statin therapy in the past, not currently on statin. I  DISCUSSED WITH THE PATIENT AT LENGTH REGARDING THE GOALS OF GLYCEMIC CONTROL AND POSSIBLE LONG-TERM COMPLICATIONS.  I  ALSO STRESSED THE IMPORTANCE OF COMPLIANCE WITH HOME GLUCOSE MONITORING, DIETARY RESTRICTIONS INCLUDING AVOIDANCE OF SUGARY DRINKS/PROCESSED FOODS,  ALONG WITH REGULAR EXERCISE.  I  ALSO STRESSED THE IMPORTANCE OF ANNUAL EYE EXAMS, SELF FOOT CARE AND COMPLIANCE WITH OFFICE VISITS.  4. Abnormal SPEP Comments: I will recheck SPEP today. At this time, she declines Hematology evaluation.  - Protein electrophoresis, serum  5. Class 1 obesity due to excess calories with serious comorbidity and body mass index (BMI) of 34.0 to 34.9 in adult Comments: She was congratulated on her 11lb weight loss since Dec 2022. She is encouraged to aim for at least 150 minutes of exercise per week.   6. Tetanus, diphtheria, and acellular pertussis (Tdap) vaccination declined  7. Herpes zoster vaccination declined  8. Chronic kidney disease, stage 3a ([place]) She is encouraged to stay well hydrated, keep BP controlled and to avoid NSAIDs to decrease risk of CKD progression.   Patient was given opportunity to ask questions. Patient verbalized understanding of the plan and was able to repeat key elements of the plan. All questions were answered to their satisfaction.   I, Maximino Greenland, MD, have reviewed all documentation for this visit. The documentation on 08/06/21 for the exam, diagnosis, procedures, and orders are all accurate and complete.   THE PATIENT IS ENCOURAGED TO PRACTICE SOCIAL DISTANCING DUE TO THE COVID-19 PANDEMIC.

## 2021-08-02 ENCOUNTER — Ambulatory Visit
Admission: RE | Admit: 2021-08-02 | Discharge: 2021-08-02 | Disposition: A | Discharge status: Home / Self Care | Payer: Medicare PPO | Source: Ambulatory Visit | Attending: Internal Medicine | Admitting: Internal Medicine

## 2021-08-02 DIAGNOSIS — Z1231 Encounter for screening mammogram for malignant neoplasm of breast: Secondary | ICD-10-CM

## 2021-08-02 LAB — LIPID PANEL
Chol/HDL Ratio: 4.5 ratio — ABNORMAL HIGH (ref 0.0–4.4)
Cholesterol, Total: 249 mg/dL — ABNORMAL HIGH (ref 100–199)
HDL: 55 mg/dL (ref >39)
LDL Chol Calc (NIH): 173 mg/dL — ABNORMAL HIGH (ref 0–99)
Triglycerides: 119 mg/dL (ref 0–149)
VLDL Cholesterol Cal: 21 mg/dL (ref 5–40)

## 2021-08-02 LAB — CBC
Hematocrit: 39.2 % (ref 34.0–46.6)
Hemoglobin: 12.8 g/dL (ref 11.1–15.9)
MCH: 28.4 pg (ref 26.6–33.0)
MCHC: 32.7 g/dL (ref 31.5–35.7)
MCV: 87 fL (ref 79–97)
Platelets: 360 10*3/uL (ref 150–450)
RBC: 4.5 x10E6/uL (ref 3.77–5.28)
RDW: 13.7 % (ref 11.7–15.4)
WBC: 7.2 10*3/uL (ref 3.4–10.8)

## 2021-08-02 LAB — PROTEIN ELECTROPHORESIS, SERUM
A/G Ratio: 0.9 (ref 0.7–1.7)
Albumin ELP: 3.7 g/dL (ref 2.9–4.4)
Alpha 1: 0.3 g/dL (ref 0.0–0.4)
Alpha 2: 1 g/dL (ref 0.4–1.0)
Beta: 1.3 g/dL (ref 0.7–1.3)
Gamma Globulin: 1.2 g/dL (ref 0.4–1.8)
Globulin, Total: 3.9 g/dL (ref 2.2–3.9)
M-Spike, %: 0.5 g/dL — ABNORMAL HIGH

## 2021-08-02 LAB — CMP14+EGFR
ALT: 15 IU/L (ref 0–32)
AST: 22 IU/L (ref 0–40)
Albumin/Globulin Ratio: 1.4 (ref 1.2–2.2)
Albumin: 4.4 g/dL (ref 3.7–4.7)
Alkaline Phosphatase: 92 IU/L (ref 44–121)
BUN/Creatinine Ratio: 13 (ref 12–28)
BUN: 14 mg/dL (ref 8–27)
Bilirubin Total: 0.2 mg/dL (ref 0.0–1.2)
CO2: 25 mmol/L (ref 20–29)
Calcium: 10 mg/dL (ref 8.7–10.3)
Chloride: 103 mmol/L (ref 96–106)
Creatinine, Ser: 1.11 mg/dL — ABNORMAL HIGH (ref 0.57–1.00)
Globulin, Total: 3.2 g/dL (ref 1.5–4.5)
Glucose: 109 mg/dL — ABNORMAL HIGH (ref 70–99)
Potassium: 4.5 mmol/L (ref 3.5–5.2)
Sodium: 142 mmol/L (ref 134–144)
Total Protein: 7.6 g/dL (ref 6.0–8.5)
eGFR: 52 mL/min/{1.73_m2} — ABNORMAL LOW (ref >59)

## 2021-08-02 LAB — MICROALBUMIN / CREATININE URINE RATIO
Creatinine, Urine: 139.2 mg/dL
Microalb/Creat Ratio: 4 mg/g creat (ref 0–29)
Microalbumin, Urine: 5.6 ug/mL

## 2021-08-02 LAB — HEMOGLOBIN A1C
Est. average glucose Bld gHb Est-mCnc: 131 mg/dL
Hgb A1c MFr Bld: 6.2 % — ABNORMAL HIGH (ref 4.8–5.6)

## 2021-08-06 ENCOUNTER — Other Ambulatory Visit: Payer: Self-pay | Admitting: Internal Medicine

## 2021-08-06 DIAGNOSIS — R778 Other specified abnormalities of plasma proteins: Secondary | ICD-10-CM

## 2021-08-22 ENCOUNTER — Telehealth: Payer: Self-pay | Admitting: Hematology and Oncology

## 2021-08-22 NOTE — Telephone Encounter (Signed)
Scheduled appt per 6/3 referral. Pt is aware of appt date and time. Pt is aware to arrive 15 mins prior to appt time and to bring and updated insurance card. Pt is aware of appt location.

## 2021-09-10 NOTE — Progress Notes (Unsigned)
[place] [facility] [phone]   Fax:([phone] [place] NOTE  Patient Care Team: Glendale Chard, MD as PCP - General (Internal Medicine) Marylynn Pearson, MD as Consulting Physician (Ophthalmology)  Hematological/Oncological History # Abnormal SPEP 02/15/2021: M protein 0.5  08/01/2021: M protein 0.5 09/11/2021: establish care with Dr. Lorenso Courier    CHIEF COMPLAINTS/PURPOSE OF CONSULTATION:  "Abnormal SPEP "  HISTORY OF PRESENTING ILLNESS:  Amy Serrano 76 y.o. female with medical history significant for hyperlipidemia, hypertension, and endometriosis who presents for evaluation of an abnormal SPEP.  On review of the previous records Amy Serrano underwent an SPEP on 02/15/2021 which showed M protein of 0.5.  On 08/01/2021 the patient had a repeat SPEP which showed an M protein of 0.5.  Due to concern for the elevated M protein the patient was referred to hematology for further evaluation and management.  On exam today Amy Serrano reports that her husband had multiple myeloma and passed away from the disease.  She notes that her energy level is good and she takes about 6000 steps per day.  She knows she is not having any issues with fatigue and eats a well-balanced diet.  She is also doing her best to hydrate well.  She is eating well overall and is not having any bone or back pain.  He also notes no abnormalities in her urine including bubbling, change in the color, or foul smell.  Overall her weight has been stable she does have some occasional swelling in her lower extremities which she associates with her amlodipine therapy.  On further discussion she reports that her family history is remarkable for CVA in her mother and MI in her dad.  Her sister died of an unknown type of cancer.  She reports that she is a never drinker but did smoke cigarettes back in the day but quit in the 1960s/70s.  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  She otherwise denies any fevers, chills, sweats, nausea, vomiting or diarrhea.  A full 10 point ROS is listed below.  MEDICAL HISTORY:  Past Medical History:  Diagnosis Date   Endometriosis    Hyperlipidemia    Hypertension     SURGICAL HISTORY: Past Surgical History:  Procedure Laterality Date   arm surgery     left    SOCIAL HISTORY: Social History   Socioeconomic History   Marital status: Widowed    Spouse name: Not on file   Number of children: Not on file   Years of education: Not on file   Highest education level: Not on file  Occupational History   Occupation: retired  Tobacco Use   Smoking status: Never   Smokeless tobacco: Never  Vaping Use   Vaping Use: Never used  Substance and Sexual Activity   Alcohol use: No   Drug use: No   Sexual activity: Not Currently  Other Topics Concern   Not on file  Social History Narrative   Not on file   Social Determinants of Health   Financial Resource Strain: Low Risk  (02/15/2021)   Overall Financial Resource Strain (CARDIA)    Difficulty of Paying Living Expenses: Not hard at all  Food Insecurity: No Food Insecurity (02/15/2021)   Hunger Vital Sign    Worried About Running Out of Food in the Last Year: Never true    [place] in the Last Year: Never true  Transportation Needs:  No Transportation Needs (02/15/2021)   PRAPARE - Hydrologist (Medical): No    Lack of Transportation (Non-Medical): No  Physical Activity: Insufficiently Active (02/15/2021)   Exercise Vital Sign    Days of Exercise per Week: 7 days    Minutes of Exercise per Session: 20 min  Stress: No Stress Concern Present (02/15/2021)   [place]    Feeling of Stress : Not at all  Social Connections: Not on file  Intimate Partner Violence: Not At Risk (01/01/2018)   Humiliation, Afraid, Rape, and Kick questionnaire     Fear of Current or Ex-Partner: No    Emotionally Abused: No    Physically Abused: No    Sexually Abused: No    FAMILY HISTORY: Family History  Problem Relation Age of Onset   Hypertension Mother    Heart attack Father    Asthma Brother    Hypertension Other    Stroke Other     ALLERGIES:  is allergic to benazepril, justicia adhatoda (malabar nut tree) [justicia adhatoda], ace inhibitors, and peanut butter flavor.  MEDICATIONS:  Current Outpatient Medications  Medication Sig Dispense Refill   amLODipine (NORVASC) 10 MG tablet Take 1 tablet (10 mg total) by mouth daily. 90 tablet 1   aspirin 81 MG tablet Take 81 mg by mouth at bedtime.      Cholecalciferol (VITAMIN D3) 125 MCG (5000 UT) CAPS Take 1 capsule by mouth daily.     EPINEPHrine 0.3 mg/0.3 mL IJ SOAJ injection Inject 0.3 mg into the muscle as needed for anaphylaxis. 1 each 0   hydrochlorothiazide (HYDRODIURIL) 12.5 MG tablet One tab po qd 90 tablet 1   Multiple Vitamin (MULTIVITAMIN) tablet Take 1 tablet by mouth daily.     Omega-3 Fatty Acids (OMEGA 3 PO) Take 1 tablet by mouth daily at 12 noon.     No current facility-administered medications for this visit.    REVIEW OF SYSTEMS:   Constitutional: ( - ) fevers, ( - )  chills , ( - ) night sweats Eyes: ( - ) blurriness of vision, ( - ) double vision, ( - ) watery eyes Ears, nose, mouth, throat, and face: ( - ) mucositis, ( - ) sore throat Respiratory: ( - ) cough, ( - ) dyspnea, ( - ) wheezes Cardiovascular: ( - ) palpitation, ( - ) chest discomfort, ( - ) lower extremity swelling Gastrointestinal:  ( - ) nausea, ( - ) heartburn, ( - ) change in bowel habits Skin: ( - ) abnormal skin rashes Lymphatics: ( - ) new lymphadenopathy, ( - ) easy bruising Neurological: ( - ) numbness, ( - ) tingling, ( - ) new weaknesses Behavioral/Psych: ( - ) mood change, ( - ) new changes  All other systems were reviewed with the patient and are negative.  PHYSICAL  EXAMINATION:  Vitals:   09/11/21 0915  BP: 131/62  Pulse: 70  Resp: 16  Temp: 98.3 F (36.8 C)  SpO2: 98%   Filed Weights   09/11/21 0915  Weight: 201 lb 6.4 oz (91.4 kg)    GENERAL: well appearing elderly African-American female in NAD  SKIN: skin color, texture, turgor are normal, no rashes or significant lesions EYES: conjunctiva are pink and non-injected, sclera clear LUNGS: clear to auscultation and percussion with normal breathing effort HEART: regular rate & rhythm and no murmurs and no lower extremity edema Musculoskeletal: no cyanosis of digits and no  clubbing  PSYCH: alert & oriented x 3, fluent speech NEURO: no focal motor/sensory deficits  LABORATORY DATA:  I have reviewed the data as listed    Latest Ref Rng & Units 08/01/2021    4:14 PM 02/06/2021   10:12 AM 02/11/2020   11:42 AM  CBC  WBC 3.4 - 10.8 x10E3/uL 7.2  9.4  7.1   Hemoglobin 11.1 - 15.9 g/dL 12.8  13.7  12.0   Hematocrit 34.0 - 46.6 % 39.2  43.3  37.3   Platelets 150 - 450 x10E3/uL 360  372  331        Latest Ref Rng & Units 08/01/2021    4:14 PM 02/15/2021   10:01 AM 02/06/2021   10:12 AM  CMP  Glucose 70 - 99 mg/dL 109   163   BUN 8 - 27 mg/dL 14   23   Creatinine 0.57 - 1.00 mg/dL 1.11   1.01   Sodium 134 - 144 mmol/L 142   138   Potassium 3.5 - 5.2 mmol/L 4.5   4.0   Chloride 96 - 106 mmol/L 103   110   CO2 20 - 29 mmol/L 25   23   Calcium 8.7 - 10.3 mg/dL 10.0   9.0   Total Protein 6.0 - 8.5 g/dL 7.6  7.1    Total Bilirubin 0.0 - 1.2 mg/dL 0.2  0.3    Alkaline Phos 44 - 121 IU/L 92  79    AST 0 - 40 IU/L 22  18    ALT 0 - 32 IU/L 15  19       ASSESSMENT & PLAN Amy Serrano 76 y.o. female with medical history significant for hyperlipidemia, hypertension, and endometriosis who presents for evaluation of an abnormal SPEP.  After review of the labs, review of the records, and discussion with the patient the patients findings are most consistent with a monoclonal gammopathy with  an elevated M protein of 0.5.  Monoclonal Gammopathies are a group of medical conditions defined by the presence of a monoclonal protein (an M protein) in the blood or urine. Monoclonal gammopathies include monoclonal gammopathy of unknown significance (MGUS), Monoclonal gammopathies of renal or neurological significance,  smoldering multiple myeloma (SMM), multiple myeloma (MM), AL amyloidosis, and Waldenstrom macroglobulinemia. The goal of the initial workup is to determine which monoclonal gammopathy a patient has. The workup consists of evaluating protein in the serum (with serum protein electrophoresis (SPEP) and serum free light chains) , evaluating protein in the urine (UPEP), and evaluation of the skeleton (DG Bone Met Survey) to assure no lytic lesions. Baseline bloodwork includes CMP and CBC. If no CRAB criteria or high risk criteria are noted then the diagnosis is MGUS. MGUS must be followed with bloodwork periodically to assure it does not convert to multiple myeloma (occurs to approximately 1% of patients per year). If there are CRAB criteria or high risk features (such as elevated serum free light chain ratio (taking into account renal function), a non IgG M protein, or M protein >1.5) then a bone marrow biopsy must be pursued.    #Elevated M protein on SPEP --today will order an SPEP, UPEP, SFLC and beta 2 microglobulin --additionally will collect new baseline CBC, CMP, and LDH --recommend a metastatic bone survey to assess for lytic lesions --will consider the need for a bone marrow biopsy pending the above results --RTC in 6 months or sooner if concerning abnormalities are seen in the above work-up.  Orders Placed  This Encounter  Procedures   DG Bone Survey Met    Standing Status:   Future    Standing Expiration Date:   09/12/2022    Order Specific Question:   Reason for Exam (SYMPTOM  OR DIAGNOSIS REQUIRED)    Answer:   MGUS, assess for lytic lesions    Order Specific Question:    Preferred imaging location?    Answer:   [facility]   CBC with Differential (Cancer Center Only)    Standing Status:   Future    Standing Expiration Date:   09/12/2022   CMP ([facility] only)    Standing Status:   Future    Standing Expiration Date:   09/12/2022   Lactate dehydrogenase (LDH)    Standing Status:   Future    Standing Expiration Date:   09/11/2022   Multiple Myeloma Panel (SPEP&IFE w/QIG)    Standing Status:   Future    Standing Expiration Date:   09/11/2022   Kappa/lambda light chains    Standing Status:   Future    Standing Expiration Date:   09/11/2022   Beta 2 microglobulin    Standing Status:   Future    Standing Expiration Date:   09/11/2022   24-Hr Ur UPEP/UIFE/Light Chains/TP    Standing Status:   Future    Standing Expiration Date:   09/11/2022    All questions were answered. The patient knows to call the clinic with any problems, questions or concerns.  A total of more than 60 minutes were spent on this encounter with face-to-face time and non-face-to-face time, including preparing to see the patient, ordering tests and/or medications, counseling the patient and coordination of care as outlined above.   Ledell Peoples, MD Department of Hematology/Oncology [facility] Phone: [phone] Pager: [phone] Email: [email or web address]  09/11/2021 9:53 AM

## 2021-09-11 ENCOUNTER — Other Ambulatory Visit: Payer: Self-pay

## 2021-09-11 ENCOUNTER — Inpatient Hospital Stay: Payer: Medicare PPO

## 2021-09-11 ENCOUNTER — Inpatient Hospital Stay: Payer: Medicare PPO | Attending: Hematology and Oncology | Admitting: Hematology and Oncology

## 2021-09-11 VITALS — BP 131/62 | HR 70 | Temp 98.3°F | Resp 16 | Ht 64.0 in | Wt 201.4 lb

## 2021-09-11 DIAGNOSIS — D472 Monoclonal gammopathy: Secondary | ICD-10-CM

## 2021-09-11 DIAGNOSIS — Z79899 Other long term (current) drug therapy: Secondary | ICD-10-CM | POA: Diagnosis not present

## 2021-09-11 DIAGNOSIS — E785 Hyperlipidemia, unspecified: Secondary | ICD-10-CM | POA: Insufficient documentation

## 2021-09-11 DIAGNOSIS — I10 Essential (primary) hypertension: Secondary | ICD-10-CM | POA: Insufficient documentation

## 2021-09-11 LAB — CMP (CANCER CENTER ONLY)
ALT: 16 U/L (ref 0–44)
AST: 22 U/L (ref 15–41)
Albumin: 4.2 g/dL (ref 3.5–5.0)
Alkaline Phosphatase: 74 U/L (ref 38–126)
Anion gap: 8 (ref 5–15)
BUN: 18 mg/dL (ref 8–23)
CO2: 30 mmol/L (ref 22–32)
Calcium: 9.8 mg/dL (ref 8.9–10.3)
Chloride: 101 mmol/L (ref 98–111)
Creatinine: 1.01 mg/dL — ABNORMAL HIGH (ref 0.44–1.00)
GFR, Estimated: 57 mL/min — ABNORMAL LOW (ref >60)
Glucose, Bld: 111 mg/dL — ABNORMAL HIGH (ref 70–99)
Potassium: 4 mmol/L (ref 3.5–5.1)
Sodium: 139 mmol/L (ref 135–145)
Total Bilirubin: 0.4 mg/dL (ref 0.3–1.2)
Total Protein: 8 g/dL (ref 6.5–8.1)

## 2021-09-11 LAB — CBC WITH DIFFERENTIAL (CANCER CENTER ONLY)
Abs Immature Granulocytes: 0.02 10*3/uL (ref 0.00–0.07)
Basophils Absolute: 0 10*3/uL (ref 0.0–0.1)
Basophils Relative: 1 %
Eosinophils Absolute: 0.1 10*3/uL (ref 0.0–0.5)
Eosinophils Relative: 1 %
HCT: 38.1 % (ref 36.0–46.0)
Hemoglobin: 12.4 g/dL (ref 12.0–15.0)
Immature Granulocytes: 0 %
Lymphocytes Relative: 19 %
Lymphs Abs: 1.3 10*3/uL (ref 0.7–4.0)
MCH: 28.6 pg (ref 26.0–34.0)
MCHC: 32.5 g/dL (ref 30.0–36.0)
MCV: 87.8 fL (ref 80.0–100.0)
Monocytes Absolute: 0.6 10*3/uL (ref 0.1–1.0)
Monocytes Relative: 9 %
Neutro Abs: 4.6 10*3/uL (ref 1.7–7.7)
Neutrophils Relative %: 70 %
Platelet Count: 338 10*3/uL (ref 150–400)
RBC: 4.34 MIL/uL (ref 3.87–5.11)
RDW: 14 % (ref 11.5–15.5)
WBC Count: 6.5 10*3/uL (ref 4.0–10.5)
nRBC: 0 % (ref 0.0–0.2)

## 2021-09-11 LAB — LACTATE DEHYDROGENASE: LDH: 169 U/L (ref 98–192)

## 2021-09-12 ENCOUNTER — Telehealth: Payer: Self-pay | Admitting: Hematology and Oncology

## 2021-09-12 LAB — KAPPA/LAMBDA LIGHT CHAINS
Kappa free light chain: 36.6 mg/L — ABNORMAL HIGH (ref 3.3–19.4)
Kappa, lambda light chain ratio: 2.73 — ABNORMAL HIGH (ref 0.26–1.65)
Lambda free light chains: 13.4 mg/L (ref 5.7–26.3)

## 2021-09-12 NOTE — Telephone Encounter (Signed)
Scheduled per 7/10 secure chat, message has been left

## 2021-09-13 LAB — BETA 2 MICROGLOBULIN, SERUM: Beta-2 Microglobulin: 2.1 mg/L (ref 0.6–2.4)

## 2021-09-15 ENCOUNTER — Telehealth: Payer: Self-pay | Admitting: *Deleted

## 2021-09-15 LAB — MULTIPLE MYELOMA PANEL, SERUM
Albumin SerPl Elph-Mcnc: 3.7 g/dL (ref 2.9–4.4)
Albumin/Glob SerPl: 1.1 (ref 0.7–1.7)
Alpha 1: 0.2 g/dL (ref 0.0–0.4)
Alpha2 Glob SerPl Elph-Mcnc: 1 g/dL (ref 0.4–1.0)
B-Globulin SerPl Elph-Mcnc: 1.3 g/dL (ref 0.7–1.3)
Gamma Glob SerPl Elph-Mcnc: 1.1 g/dL (ref 0.4–1.8)
Globulin, Total: 3.5 g/dL (ref 2.2–3.9)
IgA: 272 mg/dL (ref 64–422)
IgG (Immunoglobin G), Serum: 1297 mg/dL (ref 586–1602)
IgM (Immunoglobulin M), Srm: 32 mg/dL (ref 26–217)
M Protein SerPl Elph-Mcnc: 0.5 g/dL — ABNORMAL HIGH
Total Protein ELP: 7.2 g/dL (ref 6.0–8.5)

## 2021-09-15 NOTE — Telephone Encounter (Signed)
Received call from pt saying that she received a call from the cancer center but she is not sure why. Attempted call back. No answer . Left vm message for her saying that likley she was being called about her next about on 03/15/22 @  11:15 Advised to call back with any questions or concerns

## 2021-09-27 ENCOUNTER — Other Ambulatory Visit (HOSPITAL_COMMUNITY): Payer: Medicare PPO

## 2021-10-19 ENCOUNTER — Ambulatory Visit: Payer: Medicare PPO | Admitting: Emergency Medicine

## 2021-10-19 ENCOUNTER — Encounter: Payer: Self-pay | Admitting: Emergency Medicine

## 2021-10-19 VITALS — BP 128/72 | HR 73 | Temp 98.3°F | Ht 64.0 in | Wt 200.0 lb

## 2021-10-19 DIAGNOSIS — I10 Essential (primary) hypertension: Secondary | ICD-10-CM

## 2021-10-19 DIAGNOSIS — Z7689 Persons encountering health services in other specified circumstances: Secondary | ICD-10-CM | POA: Diagnosis not present

## 2021-10-19 DIAGNOSIS — N1831 Chronic kidney disease, stage 3a: Secondary | ICD-10-CM

## 2021-10-19 NOTE — Patient Instructions (Signed)
Health Maintenance After Age 77 After age 77, you are at a higher risk for certain long-term diseases and infections as well as injuries from falls. Falls are a major cause of broken bones and head injuries in people who are older than age 77. Getting regular preventive care can help to keep you healthy and well. Preventive care includes getting regular testing and making lifestyle changes as recommended by your health care provider. Talk with your health care provider about: Which screenings and tests you should have. A screening is a test that checks for a disease when you have no symptoms. A diet and exercise plan that is right for you. What should I know about screenings and tests to prevent falls? Screening and testing are the best ways to find a health problem early. Early diagnosis and treatment give you the best chance of managing medical conditions that are common after age 77. Certain conditions and lifestyle choices may make you more likely to have a fall. Your health care provider may recommend: Regular vision checks. Poor vision and conditions such as cataracts can make you more likely to have a fall. If you wear glasses, make sure to get your prescription updated if your vision changes. Medicine review. Work with your health care provider to regularly review all of the medicines you are taking, including over-the-counter medicines. Ask your health care provider about any side effects that may make you more likely to have a fall. Tell your health care provider if any medicines that you take make you feel dizzy or sleepy. Strength and balance checks. Your health care provider may recommend certain tests to check your strength and balance while standing, walking, or changing positions. Foot health exam. Foot pain and numbness, as well as not wearing proper footwear, can make you more likely to have a fall. Screenings, including: Osteoporosis screening. Osteoporosis is a condition that causes  the bones to get weaker and break more easily. Blood pressure screening. Blood pressure changes and medicines to control blood pressure can make you feel dizzy. Depression screening. You may be more likely to have a fall if you have a fear of falling, feel depressed, or feel unable to do activities that you used to do. Alcohol use screening. Using too much alcohol can affect your balance and may make you more likely to have a fall. Follow these instructions at home: Lifestyle Do not drink alcohol if: Your health care provider tells you not to drink. If you drink alcohol: Limit how much you have to: 0-1 drink a day for women. 0-2 drinks a day for men. Know how much alcohol is in your drink. In the [place] one drink equals one 12 oz bottle of beer (355 mL), one 5 oz glass of wine (148 mL), or one 1 oz glass of hard liquor (44 mL). Do not use any products that contain nicotine or tobacco. These products include cigarettes, chewing tobacco, and vaping devices, such as e-cigarettes. If you need help quitting, ask your health care provider. Activity  Follow a regular exercise program to stay fit. This will help you maintain your balance. Ask your health care provider what types of exercise are appropriate for you. If you need a cane or walker, use it as recommended by your health care provider. Wear supportive shoes that have nonskid soles. Safety  Remove any tripping hazards, such as rugs, cords, and clutter. Install safety equipment such as grab bars in bathrooms and safety rails on stairs. Keep rooms and walkways   well-lit. General instructions Talk with your health care provider about your risks for falling. Tell your health care provider if: You fall. Be sure to tell your health care provider about all falls, even ones that seem minor. You feel dizzy, tiredness (fatigue), or off-balance. Take over-the-counter and prescription medicines only as told by your health care provider. These include  supplements. Eat a healthy diet and maintain a healthy weight. A healthy diet includes low-fat dairy products, low-fat (lean) meats, and fiber from whole grains, beans, and lots of fruits and vegetables. Stay current with your vaccines. Schedule regular health, dental, and eye exams. Summary Having a healthy lifestyle and getting preventive care can help to protect your health and wellness after age 77. Screening and testing are the best way to find a health problem early and help you avoid having a fall. Early diagnosis and treatment give you the best chance for managing medical conditions that are more common for people who are older than age 77. Falls are a major cause of broken bones and head injuries in people who are older than age 77. Take precautions to prevent a fall at home. Work with your health care provider to learn what changes you can make to improve your health and wellness and to prevent falls. This information is not intended to replace advice given to you by your health care provider. Make sure you discuss any questions you have with your health care provider. Document Revised: 07/11/2020 Document Reviewed: 07/11/2020 Elsevier Patient Education  2023 Elsevier Inc.  

## 2021-10-19 NOTE — Progress Notes (Signed)
Amy Serrano 76 y.o.   Chief Complaint  Patient presents with   New Patient (Initial Visit)    HISTORY OF PRESENT ILLNESS: This is a 76 y.o. female first visit to this office, here to establish care with me. Has history of hypertension on amlodipine 10 mg and hydrochlorothiazide 12.5 mg daily. No other significant chronic medical problems. Healthy lifestyle. No other complaints or medical concerns today. Recent blood work results reviewed and discussed with patient.  Patient also has CKD 3a.  HPI   Prior to Admission medications   Medication Sig Start Date End Date Taking? Authorizing Provider  amLODipine (NORVASC) 10 MG tablet Take 1 tablet (10 mg total) by mouth daily. 08/01/21  Yes Glendale Chard, MD  aspirin 81 MG tablet Take 81 mg by mouth at bedtime.    Yes [provider]  EPINEPHrine 0.3 mg/0.3 mL IJ SOAJ injection Inject 0.3 mg into the muscle as needed for anaphylaxis. 02/06/21  Yes Luna Fuse, MD  hydrochlorothiazide (HYDRODIURIL) 12.5 MG tablet One tab po qd 08/01/21  Yes Glendale Chard, MD  Multiple Vitamin (MULTIVITAMIN) tablet Take 1 tablet by mouth daily.   Yes [provider]  Omega-3 Fatty Acids (OMEGA 3 PO) Take 1 tablet by mouth daily at 12 noon.   Yes [provider]  Cholecalciferol (VITAMIN D3) 125 MCG (5000 UT) CAPS Take 1 capsule by mouth daily. Patient not taking: Reported on 10/19/2021    [provider]  amLODipine-benazepril (LOTREL) 10-20 MG per capsule Take 1 capsule by mouth daily.  08/07/14  [provider]  dicyclomine (BENTYL) 10 MG capsule Take 2 capsules (20 mg total) by mouth 3 (three) times daily between meals as needed for spasms. Patient not taking: Reported on 08/05/2015 08/01/14 08/05/15  Orlie Dakin, MD  famotidine (PEPCID) 20 MG tablet Take 1 tablet (20 mg total) by mouth 2 (two) times daily. Patient not taking: Reported on 08/05/2015 08/07/14 08/05/15  Noemi Chapel, MD  metoCLOPramide (REGLAN)  10 MG tablet Take 1 tablet (10 mg total) by mouth every 6 (six) hours as needed for nausea (nausea/headache). Patient not taking: Reported on 08/05/2015 08/01/14 08/05/15  Orlie Dakin, MD    Allergies  Allergen Reactions   Benazepril Swelling    Swelling of the face    Justicia Adhatoda ([place]) Conan Bowens Adhatoda] Anaphylaxis   Ace Inhibitors     Angioedema   Peanut Butter Flavor     Patient Active Problem List   Diagnosis Date Noted   Hypertensive nephropathy 01/01/2018   Chronic renal disease, stage II 01/01/2018   Other abnormal glucose 01/01/2018   Class 2 drug-induced obesity with serious comorbidity and body mass index (BMI) of 35.0 to 35.9 in adult 01/01/2018    Past Medical History:  Diagnosis Date   Endometriosis    Hyperlipidemia    Hypertension     Past Surgical History:  Procedure Laterality Date   arm surgery     left    Social History   Socioeconomic History   Marital status: Widowed    Spouse name: Not on file   Number of children: Not on file   Years of education: Not on file   Highest education level: Not on file  Occupational History   Occupation: retired  Tobacco Use   Smoking status: Never   Smokeless tobacco: Never  Vaping Use   Vaping Use: Never used  Substance and Sexual Activity   Alcohol use: No   Drug use: No  Sexual activity: Not Currently  Other Topics Concern   Not on file  Social History Narrative   Not on file   Social Determinants of Health   Financial Resource Strain: Low Risk  (02/15/2021)   Overall Financial Resource Strain (CARDIA)    Difficulty of Paying Living Expenses: Not hard at all  Food Insecurity: No Food Insecurity (02/15/2021)   Hunger Vital Sign    Worried About Running Out of Food in the Last Year: Never true    Ran Out of Food in the Last Year: Never true  Transportation Needs: No Transportation Needs (02/15/2021)   PRAPARE - Hydrologist (Medical): No    Lack  of Transportation (Non-Medical): No  Physical Activity: Insufficiently Active (02/15/2021)   Exercise Vital Sign    Days of Exercise per Week: 7 days    Minutes of Exercise per Session: 20 min  Stress: No Stress Concern Present (02/15/2021)   [place]    Feeling of Stress : Not at all  Social Connections: Not on file  Intimate Partner Violence: Not At Risk (01/01/2018)   Humiliation, Afraid, Rape, and Kick questionnaire    Fear of Current or Ex-Partner: No    Emotionally Abused: No    Physically Abused: No    Sexually Abused: No    Family History  Problem Relation Age of Onset   Hypertension Mother    Heart attack Father    Asthma Brother    Hypertension Other    Stroke Other      Review of Systems  Constitutional: Negative.  Negative for chills and fever.  HENT: Negative.  Negative for congestion and sore throat.   Respiratory: Negative.  Negative for cough and shortness of breath.   Cardiovascular: Negative.  Negative for chest pain and palpitations.  Gastrointestinal:  Negative for abdominal pain, diarrhea, nausea and vomiting.  Genitourinary: Negative.   Musculoskeletal: Negative.   Skin: Negative.  Negative for rash.  Neurological: Negative.  Negative for dizziness and headaches.  All other systems reviewed and are negative.   Today's Vitals   10/19/21 1024  BP: 128/72  Pulse: 73  Temp: 98.3 F (36.8 C)  TempSrc: Oral  SpO2: 99%  Weight: 200 lb (90.7 kg)  Height: '5\' 4"'$  (1.626 m)   Body mass index is 34.33 kg/m.  Physical Exam Vitals reviewed.  Constitutional:      Appearance: Normal appearance.  HENT:     Head: Normocephalic.  Eyes:     Extraocular Movements: Extraocular movements intact.     Pupils: Pupils are equal, round, and reactive to light.  Cardiovascular:     Rate and Rhythm: Normal rate and regular rhythm.     Pulses: Normal pulses.     Heart sounds: Normal heart  sounds.  Pulmonary:     Effort: Pulmonary effort is normal.     Breath sounds: Normal breath sounds.  Musculoskeletal:     Cervical back: No tenderness.     Right lower leg: No edema.     Left lower leg: No edema.  Lymphadenopathy:     Cervical: No cervical adenopathy.  Skin:    General: Skin is warm and dry.     Capillary Refill: Capillary refill takes less than 2 seconds.  Neurological:     General: No focal deficit present.     Mental Status: She is alert and oriented to person, place, and time.  Psychiatric:  Mood and Affect: Mood normal.        Behavior: Behavior normal.      ASSESSMENT & PLAN: A total of 49 minutes was spent with the patient and counseling/coordination of care regarding preparing for this visit, review of available medical records, review of most recent blood work results, review of multiple chronic medical problems and their management, review of all medications, establishing care with me, cardiovascular risks associated with hypertension, education on nutrition, review of health maintenance items, prognosis, documentation, need for follow-up.  Problem List Items Addressed This Visit       Cardiovascular and Mediastinum   Essential hypertension - Primary    Well-controlled hypertension. Continue amlodipine 10 mg and hydrochlorothiazide 12.5 mg daily. BP Readings from Last 3 Encounters:  10/19/21 128/72  09/11/21 131/62  08/01/21 120/76  Cardiovascular risks associated with hypertension discussed. Dietary approaches to stop hypertension discussed. Follow-up in 6 months.         Genitourinary   Stage 3a chronic kidney disease ([place])    Advised to stay well-hydrated and avoid NSAIDs.      Other Visit Diagnoses     Encounter to establish care          Patient Instructions  Health Maintenance After Age 29 After age 67, you are at a higher risk for certain long-term diseases and infections as well as injuries from falls. Falls are a  major cause of broken bones and head injuries in people who are older than age 49. Getting regular preventive care can help to keep you healthy and well. Preventive care includes getting regular testing and making lifestyle changes as recommended by your health care provider. Talk with your health care provider about: Which screenings and tests you should have. A screening is a test that checks for a disease when you have no symptoms. A diet and exercise plan that is right for you. What should I know about screenings and tests to prevent falls? Screening and testing are the best ways to find a health problem early. Early diagnosis and treatment give you the best chance of managing medical conditions that are common after age 22. Certain conditions and lifestyle choices may make you more likely to have a fall. Your health care provider may recommend: Regular vision checks. Poor vision and conditions such as cataracts can make you more likely to have a fall. If you wear glasses, make sure to get your prescription updated if your vision changes. Medicine review. Work with your health care provider to regularly review all of the medicines you are taking, including over-the-counter medicines. Ask your health care provider about any side effects that may make you more likely to have a fall. Tell your health care provider if any medicines that you take make you feel dizzy or sleepy. Strength and balance checks. Your health care provider may recommend certain tests to check your strength and balance while standing, walking, or changing positions. Foot health exam. Foot pain and numbness, as well as not wearing proper footwear, can make you more likely to have a fall. Screenings, including: Osteoporosis screening. Osteoporosis is a condition that causes the bones to get weaker and break more easily. Blood pressure screening. Blood pressure changes and medicines to control blood pressure can make you feel  dizzy. Depression screening. You may be more likely to have a fall if you have a fear of falling, feel depressed, or feel unable to do activities that you used to do. Alcohol use screening. Using too  much alcohol can affect your balance and may make you more likely to have a fall. Follow these instructions at home: Lifestyle Do not drink alcohol if: Your health care provider tells you not to drink. If you drink alcohol: Limit how much you have to: 0-1 drink a day for women. 0-2 drinks a day for men. Know how much alcohol is in your drink. In the [place] one drink equals one 12 oz bottle of beer (355 mL), one 5 oz glass of wine (148 mL), or one 1 oz glass of hard liquor (44 mL). Do not use any products that contain nicotine or tobacco. These products include cigarettes, chewing tobacco, and vaping devices, such as e-cigarettes. If you need help quitting, ask your health care provider. Activity  Follow a regular exercise program to stay fit. This will help you maintain your balance. Ask your health care provider what types of exercise are appropriate for you. If you need a cane or walker, use it as recommended by your health care provider. Wear supportive shoes that have nonskid soles. Safety  Remove any tripping hazards, such as rugs, cords, and clutter. Install safety equipment such as grab bars in bathrooms and safety rails on stairs. Keep rooms and walkways well-lit. General instructions Talk with your health care provider about your risks for falling. Tell your health care provider if: You fall. Be sure to tell your health care provider about all falls, even ones that seem minor. You feel dizzy, tiredness (fatigue), or off-balance. Take over-the-counter and prescription medicines only as told by your health care provider. These include supplements. Eat a healthy diet and maintain a healthy weight. A healthy diet includes low-fat dairy products, low-fat (lean) meats, and fiber from whole  grains, beans, and lots of fruits and vegetables. Stay current with your vaccines. Schedule regular health, dental, and eye exams. Summary Having a healthy lifestyle and getting preventive care can help to protect your health and wellness after age 41. Screening and testing are the best way to find a health problem early and help you avoid having a fall. Early diagnosis and treatment give you the best chance for managing medical conditions that are more common for people who are older than age 21. Falls are a major cause of broken bones and head injuries in people who are older than age 58. Take precautions to prevent a fall at home. Work with your health care provider to learn what changes you can make to improve your health and wellness and to prevent falls. This information is not intended to replace advice given to you by your health care provider. Make sure you discuss any questions you have with your health care provider. Document Revised: 07/11/2020 Document Reviewed: 07/11/2020 Elsevier Patient Education  [place], MD [facility]

## 2021-10-19 NOTE — Assessment & Plan Note (Signed)
Advised to stay well-hydrated and avoid NSAIDs. ?

## 2021-10-19 NOTE — Assessment & Plan Note (Signed)
Well-controlled hypertension. Continue amlodipine 10 mg and hydrochlorothiazide 12.5 mg daily. BP Readings from Last 3 Encounters:  10/19/21 128/72  09/11/21 131/62  08/01/21 120/76  Cardiovascular risks associated with hypertension discussed. Dietary approaches to stop hypertension discussed. Follow-up in 6 months.

## 2021-11-30 ENCOUNTER — Ambulatory Visit: Payer: Medicare PPO | Admitting: Internal Medicine

## 2022-02-07 ENCOUNTER — Other Ambulatory Visit: Payer: Self-pay | Admitting: Internal Medicine

## 2022-02-08 ENCOUNTER — Ambulatory Visit: Payer: Medicare PPO

## 2022-03-13 ENCOUNTER — Ambulatory Visit: Payer: Medicare PPO | Admitting: Emergency Medicine

## 2022-03-15 ENCOUNTER — Other Ambulatory Visit: Payer: Medicare PPO

## 2022-03-15 ENCOUNTER — Ambulatory Visit: Payer: Medicare PPO | Admitting: Emergency Medicine

## 2022-03-15 ENCOUNTER — Encounter: Payer: Self-pay | Admitting: Emergency Medicine

## 2022-03-15 ENCOUNTER — Ambulatory Visit: Payer: Medicare PPO | Admitting: Hematology and Oncology

## 2022-03-15 VITALS — BP 142/88 | HR 94 | Temp 98.1°F | Ht 64.0 in | Wt 199.0 lb

## 2022-03-15 DIAGNOSIS — I10 Essential (primary) hypertension: Secondary | ICD-10-CM

## 2022-03-15 DIAGNOSIS — N1831 Chronic kidney disease, stage 3a: Secondary | ICD-10-CM | POA: Diagnosis not present

## 2022-03-15 DIAGNOSIS — R42 Dizziness and giddiness: Secondary | ICD-10-CM | POA: Diagnosis not present

## 2022-03-15 LAB — CBC WITH DIFFERENTIAL/PLATELET
Basophils Absolute: 0.1 10*3/uL (ref 0.0–0.1)
Basophils Relative: 0.8 % (ref 0.0–3.0)
Eosinophils Absolute: 0.1 10*3/uL (ref 0.0–0.7)
Eosinophils Relative: 1.5 % (ref 0.0–5.0)
HCT: 40.6 % (ref 36.0–46.0)
Hemoglobin: 13.2 g/dL (ref 12.0–15.0)
Lymphocytes Relative: 20.7 % (ref 12.0–46.0)
Lymphs Abs: 1.6 10*3/uL (ref 0.7–4.0)
MCHC: 32.4 g/dL (ref 30.0–36.0)
MCV: 87.3 fl (ref 78.0–100.0)
Monocytes Absolute: 0.8 10*3/uL (ref 0.1–1.0)
Monocytes Relative: 10.3 % (ref 3.0–12.0)
Neutro Abs: 5.1 10*3/uL (ref 1.4–7.7)
Neutrophils Relative %: 66.7 % (ref 43.0–77.0)
Platelets: 377 10*3/uL (ref 150.0–400.0)
RBC: 4.65 Mil/uL (ref 3.87–5.11)
RDW: 14.7 % (ref 11.5–15.5)
WBC: 7.7 10*3/uL (ref 4.0–10.5)

## 2022-03-15 LAB — HEMOGLOBIN A1C: Hgb A1c MFr Bld: 6.5 % (ref 4.6–6.5)

## 2022-03-15 NOTE — Assessment & Plan Note (Signed)
Short-lived dizzy spell episodes which are now gone. No red flag signs or symptoms. No signs of a stroke. Asymptomatic today. Will do blood work today.

## 2022-03-15 NOTE — Patient Instructions (Signed)
Health Maintenance After Age 78 After age 78, you are at a higher risk for certain long-term diseases and infections as well as injuries from falls. Falls are a major cause of broken bones and head injuries in people who are older than age 78. Getting regular preventive care can help to keep you healthy and well. Preventive care includes getting regular testing and making lifestyle changes as recommended by your health care provider. Talk with your health care provider about: Which screenings and tests you should have. A screening is a test that checks for a disease when you have no symptoms. A diet and exercise plan that is right for you. What should I know about screenings and tests to prevent falls? Screening and testing are the best ways to find a health problem early. Early diagnosis and treatment give you the best chance of managing medical conditions that are common after age 78. Certain conditions and lifestyle choices may make you more likely to have a fall. Your health care provider may recommend: Regular vision checks. Poor vision and conditions such as cataracts can make you more likely to have a fall. If you wear glasses, make sure to get your prescription updated if your vision changes. Medicine review. Work with your health care provider to regularly review all of the medicines you are taking, including over-the-counter medicines. Ask your health care provider about any side effects that may make you more likely to have a fall. Tell your health care provider if any medicines that you take make you feel dizzy or sleepy. Strength and balance checks. Your health care provider may recommend certain tests to check your strength and balance while standing, walking, or changing positions. Foot health exam. Foot pain and numbness, as well as not wearing proper footwear, can make you more likely to have a fall. Screenings, including: Osteoporosis screening. Osteoporosis is a condition that causes  the bones to get weaker and break more easily. Blood pressure screening. Blood pressure changes and medicines to control blood pressure can make you feel dizzy. Depression screening. You may be more likely to have a fall if you have a fear of falling, feel depressed, or feel unable to do activities that you used to do. Alcohol use screening. Using too much alcohol can affect your balance and may make you more likely to have a fall. Follow these instructions at home: Lifestyle Do not drink alcohol if: Your health care provider tells you not to drink. If you drink alcohol: Limit how much you have to: 0-1 drink a day for women. 0-2 drinks a day for men. Know how much alcohol is in your drink. In the [place] one drink equals one 12 oz bottle of beer (355 mL), one 5 oz glass of wine (148 mL), or one 1 oz glass of hard liquor (44 mL). Do not use any products that contain nicotine or tobacco. These products include cigarettes, chewing tobacco, and vaping devices, such as e-cigarettes. If you need help quitting, ask your health care provider. Activity  Follow a regular exercise program to stay fit. This will help you maintain your balance. Ask your health care provider what types of exercise are appropriate for you. If you need a cane or walker, use it as recommended by your health care provider. Wear supportive shoes that have nonskid soles. Safety  Remove any tripping hazards, such as rugs, cords, and clutter. Install safety equipment such as grab bars in bathrooms and safety rails on stairs. Keep rooms and walkways   well-lit. General instructions Talk with your health care provider about your risks for falling. Tell your health care provider if: You fall. Be sure to tell your health care provider about all falls, even ones that seem minor. You feel dizzy, tiredness (fatigue), or off-balance. Take over-the-counter and prescription medicines only as told by your health care provider. These include  supplements. Eat a healthy diet and maintain a healthy weight. A healthy diet includes low-fat dairy products, low-fat (lean) meats, and fiber from whole grains, beans, and lots of fruits and vegetables. Stay current with your vaccines. Schedule regular health, dental, and eye exams. Summary Having a healthy lifestyle and getting preventive care can help to protect your health and wellness after age 78. Screening and testing are the best way to find a health problem early and help you avoid having a fall. Early diagnosis and treatment give you the best chance for managing medical conditions that are more common for people who are older than age 78. Falls are a major cause of broken bones and head injuries in people who are older than age 78. Take precautions to prevent a fall at home. Work with your health care provider to learn what changes you can make to improve your health and wellness and to prevent falls. This information is not intended to replace advice given to you by your health care provider. Make sure you discuss any questions you have with your health care provider. Document Revised: 07/11/2020 Document Reviewed: 07/11/2020 Elsevier Patient Education  2023 [employer]vier Inc.  

## 2022-03-15 NOTE — Progress Notes (Signed)
Amy Serrano 77 y.o.   Chief Complaint  Patient presents with   Acute Visit    Patient is having off and on dizzy spells    HISTORY OF PRESENT ILLNESS: This is a 77 y.o. female A1A complaining of episodes of vertigo about 10 days ago.  No associated symptomatology. Vertigo is 100% better today.  Asymptomatic today. No other complaints or medical concerns today.  HPI   Prior to Admission medications   Medication Sig Start Date End Date Taking? Authorizing Provider  amLODipine (NORVASC) 10 MG tablet Take 1 tablet (10 mg total) by mouth daily. 08/01/21  Yes Glendale Chard, MD  aspirin 81 MG tablet Take 81 mg by mouth at bedtime.    Yes [provider]  Cholecalciferol (VITAMIN D3) 125 MCG (5000 UT) CAPS Take 1 capsule by mouth daily.   Yes [provider]  EPINEPHrine 0.3 mg/0.3 mL IJ SOAJ injection Inject 0.3 mg into the muscle as needed for anaphylaxis. 02/06/21  Yes Luna Fuse, MD  hydrochlorothiazide (HYDRODIURIL) 12.5 MG tablet One tab po qd 08/01/21  Yes Glendale Chard, MD  Multiple Vitamin (MULTIVITAMIN) tablet Take 1 tablet by mouth daily.   Yes [provider]  Omega-3 Fatty Acids (OMEGA 3 PO) Take 1 tablet by mouth daily at 12 noon.   Yes [provider]  amLODipine-benazepril (LOTREL) 10-20 MG per capsule Take 1 capsule by mouth daily.  08/07/14  [provider]  dicyclomine (BENTYL) 10 MG capsule Take 2 capsules (20 mg total) by mouth 3 (three) times daily between meals as needed for spasms. Patient not taking: Reported on 08/05/2015 08/01/14 08/05/15  Orlie Dakin, MD  famotidine (PEPCID) 20 MG tablet Take 1 tablet (20 mg total) by mouth 2 (two) times daily. Patient not taking: Reported on 08/05/2015 08/07/14 08/05/15  Noemi Chapel, MD  metoCLOPramide (REGLAN) 10 MG tablet Take 1 tablet (10 mg total) by mouth every 6 (six) hours as needed for nausea (nausea/headache). Patient not taking: Reported on 08/05/2015 08/01/14 08/05/15   Orlie Dakin, MD    Allergies  Allergen Reactions   Benazepril Swelling    Swelling of the face    Justicia Adhatoda ([place]) Conan Bowens Adhatoda] Anaphylaxis   Ace Inhibitors     Angioedema   Peanut Butter Flavor     Patient Active Problem List   Diagnosis Date Noted   Stage 3a chronic kidney disease ([place]) 10/19/2021   Essential hypertension 10/19/2021   Hypertensive nephropathy 01/01/2018   Chronic renal disease, stage II 01/01/2018   Other abnormal glucose 01/01/2018   Class 2 drug-induced obesity with serious comorbidity and body mass index (BMI) of 35.0 to 35.9 in adult 01/01/2018    Past Medical History:  Diagnosis Date   Endometriosis    Hyperlipidemia    Hypertension     Past Surgical History:  Procedure Laterality Date   arm surgery     left    Social History   Socioeconomic History   Marital status: Widowed    Spouse name: Not on file   Number of children: Not on file   Years of education: Not on file   Highest education level: Not on file  Occupational History   Occupation: retired  Tobacco Use   Smoking status: Never   Smokeless tobacco: Never  Vaping Use   Vaping Use: Never used  Substance and Sexual Activity   Alcohol use: No   Drug use: No   Sexual activity: Not Currently  Other Topics  Concern   Not on file  Social History Narrative   Not on file   Social Determinants of Health   Financial Resource Strain: Low Risk  (02/15/2021)   Overall Financial Resource Strain (CARDIA)    Difficulty of Paying Living Expenses: Not hard at all  Food Insecurity: No Food Insecurity (02/15/2021)   Hunger Vital Sign    Worried About Running Out of Food in the Last Year: Never true    Ran Out of Food in the Last Year: Never true  Transportation Needs: No Transportation Needs (02/15/2021)   PRAPARE - Hydrologist (Medical): No    Lack of Transportation (Non-Medical): No  Physical Activity: Insufficiently Active  (02/15/2021)   Exercise Vital Sign    Days of Exercise per Week: 7 days    Minutes of Exercise per Session: 20 min  Stress: No Stress Concern Present (02/15/2021)   [place]    Feeling of Stress : Not at all  Social Connections: Not on file  Intimate Partner Violence: Not At Risk (01/01/2018)   Humiliation, Afraid, Rape, and Kick questionnaire    Fear of Current or Ex-Partner: No    Emotionally Abused: No    Physically Abused: No    Sexually Abused: No    Family History  Problem Relation Age of Onset   Hypertension Mother    Heart attack Father    Asthma Brother    Hypertension Other    Stroke Other      Review of Systems  Constitutional: Negative.  Negative for chills and fever.  HENT: Negative.  Negative for congestion and sore throat.   Respiratory: Negative.  Negative for cough and shortness of breath.   Cardiovascular: Negative.  Negative for chest pain and palpitations.  Gastrointestinal:  Negative for abdominal pain, diarrhea, nausea and vomiting.  Genitourinary: Negative.  Negative for dysuria and hematuria.  Skin: Negative.  Negative for rash.  Neurological: Negative.  Negative for dizziness and headaches.  All other systems reviewed and are negative.   Today's Vitals   03/15/22 1427  BP: (!) 142/88  Pulse: 94  Temp: 98.1 F (36.7 C)  TempSrc: Oral  SpO2: 92%  Weight: 199 lb (90.3 kg)  Height: '5\' 4"'$  (1.626 m)   Body mass index is 34.16 kg/m. Wt Readings from Last 3 Encounters:  03/15/22 199 lb (90.3 kg)  10/19/21 200 lb (90.7 kg)  09/11/21 201 lb 6.4 oz (91.4 kg)    Physical Exam Vitals reviewed.  Constitutional:      Appearance: Normal appearance.  HENT:     Head: Normocephalic.     Mouth/Throat:     Mouth: Mucous membranes are moist.     Pharynx: Oropharynx is clear.  Eyes:     Extraocular Movements: Extraocular movements intact.     Conjunctiva/sclera: Conjunctivae  normal.     Pupils: Pupils are equal, round, and reactive to light.  Cardiovascular:     Rate and Rhythm: Normal rate and regular rhythm.     Pulses: Normal pulses.     Heart sounds: Normal heart sounds.  Pulmonary:     Effort: Pulmonary effort is normal.     Breath sounds: Normal breath sounds.  Abdominal:     Palpations: Abdomen is soft.     Tenderness: There is no abdominal tenderness.  Musculoskeletal:     Cervical back: No tenderness.  Lymphadenopathy:     Cervical: No cervical adenopathy.  Skin:  General: Skin is warm and dry.  Neurological:     General: No focal deficit present.     Mental Status: She is alert and oriented to person, place, and time.     Sensory: No sensory deficit.     Motor: No weakness.     Coordination: Coordination normal.     Gait: Gait normal.  Psychiatric:        Mood and Affect: Mood normal.        Behavior: Behavior normal.      ASSESSMENT & PLAN: A total of 42 minutes was spent with the patient and counseling/coordination of care regarding preparing for this visit, review of most recent office visit notes, differential diagnosis of vertigo and management, diagnosis of hypertension and cardiovascular risks associated with this condition, review of all medications, education on nutrition, prognosis, documentation and need for follow-up.  Problem List Items Addressed This Visit       Cardiovascular and Mediastinum   Essential hypertension    Well-controlled hypertension with normal blood pressure readings at home.  Continue amlodipine 10 mg and hydrochlorothiazide 12.5 mg daily. Continue daily baby aspirin.      Relevant Orders   Comprehensive metabolic panel   CBC with Differential/Platelet   Hemoglobin A1c   Lipid panel     Genitourinary   Stage 3a chronic kidney disease (HCC)    Stable.  Advised to stay well-hydrated and avoid NSAIDs.        Other   Vertigo - Primary    Short-lived dizzy spell episodes which are now  gone. No red flag signs or symptoms. No signs of a stroke. Asymptomatic today. Will do blood work today.      Relevant Orders   Comprehensive metabolic panel   CBC with Differential/Platelet   Hemoglobin A1c   Lipid panel   Patient Instructions  Health Maintenance After Age 86 After age 11, you are at a higher risk for certain long-term diseases and infections as well as injuries from falls. Falls are a major cause of broken bones and head injuries in people who are older than age 99. Getting regular preventive care can help to keep you healthy and well. Preventive care includes getting regular testing and making lifestyle changes as recommended by your health care provider. Talk with your health care provider about: Which screenings and tests you should have. A screening is a test that checks for a disease when you have no symptoms. A diet and exercise plan that is right for you. What should I know about screenings and tests to prevent falls? Screening and testing are the best ways to find a health problem early. Early diagnosis and treatment give you the best chance of managing medical conditions that are common after age 75. Certain conditions and lifestyle choices may make you more likely to have a fall. Your health care provider may recommend: Regular vision checks. Poor vision and conditions such as cataracts can make you more likely to have a fall. If you wear glasses, make sure to get your prescription updated if your vision changes. Medicine review. Work with your health care provider to regularly review all of the medicines you are taking, including over-the-counter medicines. Ask your health care provider about any side effects that may make you more likely to have a fall. Tell your health care provider if any medicines that you take make you feel dizzy or sleepy. Strength and balance checks. Your health care provider may recommend certain tests to check your  strength and balance  while standing, walking, or changing positions. Foot health exam. Foot pain and numbness, as well as not wearing proper footwear, can make you more likely to have a fall. Screenings, including: Osteoporosis screening. Osteoporosis is a condition that causes the bones to get weaker and break more easily. Blood pressure screening. Blood pressure changes and medicines to control blood pressure can make you feel dizzy. Depression screening. You may be more likely to have a fall if you have a fear of falling, feel depressed, or feel unable to do activities that you used to do. Alcohol use screening. Using too much alcohol can affect your balance and may make you more likely to have a fall. Follow these instructions at home: Lifestyle Do not drink alcohol if: Your health care provider tells you not to drink. If you drink alcohol: Limit how much you have to: 0-1 drink a day for women. 0-2 drinks a day for men. Know how much alcohol is in your drink. In the [place] one drink equals one 12 oz bottle of beer (355 mL), one 5 oz glass of wine (148 mL), or one 1 oz glass of hard liquor (44 mL). Do not use any products that contain nicotine or tobacco. These products include cigarettes, chewing tobacco, and vaping devices, such as e-cigarettes. If you need help quitting, ask your health care provider. Activity  Follow a regular exercise program to stay fit. This will help you maintain your balance. Ask your health care provider what types of exercise are appropriate for you. If you need a cane or walker, use it as recommended by your health care provider. Wear supportive shoes that have nonskid soles. Safety  Remove any tripping hazards, such as rugs, cords, and clutter. Install safety equipment such as grab bars in bathrooms and safety rails on stairs. Keep rooms and walkways well-lit. General instructions Talk with your health care provider about your risks for falling. Tell your health care provider  if: You fall. Be sure to tell your health care provider about all falls, even ones that seem minor. You feel dizzy, tiredness (fatigue), or off-balance. Take over-the-counter and prescription medicines only as told by your health care provider. These include supplements. Eat a healthy diet and maintain a healthy weight. A healthy diet includes low-fat dairy products, low-fat (lean) meats, and fiber from whole grains, beans, and lots of fruits and vegetables. Stay current with your vaccines. Schedule regular health, dental, and eye exams. Summary Having a healthy lifestyle and getting preventive care can help to protect your health and wellness after age 50. Screening and testing are the best way to find a health problem early and help you avoid having a fall. Early diagnosis and treatment give you the best chance for managing medical conditions that are more common for people who are older than age 56. Falls are a major cause of broken bones and head injuries in people who are older than age 57. Take precautions to prevent a fall at home. Work with your health care provider to learn what changes you can make to improve your health and wellness and to prevent falls. This information is not intended to replace advice given to you by your health care provider. Make sure you discuss any questions you have with your health care provider. Document Revised: 07/11/2020 Document Reviewed: 07/11/2020 Elsevier Patient Education  [place], MD [facility]

## 2022-03-15 NOTE — Assessment & Plan Note (Signed)
Well-controlled hypertension with normal blood pressure readings at home.  Continue amlodipine 10 mg and hydrochlorothiazide 12.5 mg daily. Continue daily baby aspirin.

## 2022-03-15 NOTE — Assessment & Plan Note (Signed)
Stable.  Advised to stay well-hydrated and avoid NSAIDs.

## 2022-03-16 LAB — COMPREHENSIVE METABOLIC PANEL
ALT: 14 U/L (ref 0–35)
AST: 20 U/L (ref 0–37)
Albumin: 4.2 g/dL (ref 3.5–5.2)
Alkaline Phosphatase: 73 U/L (ref 39–117)
BUN: 21 mg/dL (ref 6–23)
CO2: 25 mEq/L (ref 19–32)
Calcium: 9.4 mg/dL (ref 8.4–10.5)
Chloride: 101 mEq/L (ref 96–112)
Creatinine, Ser: 1.04 mg/dL (ref 0.40–1.20)
GFR: 51.81 mL/min — ABNORMAL LOW (ref >60.00)
Glucose, Bld: 100 mg/dL — ABNORMAL HIGH (ref 70–99)
Potassium: 4 mEq/L (ref 3.5–5.1)
Sodium: 139 mEq/L (ref 135–145)
Total Bilirubin: 0.3 mg/dL (ref 0.2–1.2)
Total Protein: 7.7 g/dL (ref 6.0–8.3)

## 2022-03-16 LAB — LIPID PANEL
Cholesterol: 234 mg/dL — ABNORMAL HIGH (ref 0–200)
HDL: 50.3 mg/dL (ref >39.00)
LDL Cholesterol: 144 mg/dL — ABNORMAL HIGH (ref 0–99)
NonHDL: 183.67
Total CHOL/HDL Ratio: 5
Triglycerides: 200 mg/dL — ABNORMAL HIGH (ref 0.0–149.0)
VLDL: 40 mg/dL (ref 0.0–40.0)

## 2022-03-17 ENCOUNTER — Other Ambulatory Visit: Payer: Self-pay | Admitting: Emergency Medicine

## 2022-03-17 DIAGNOSIS — E785 Hyperlipidemia, unspecified: Secondary | ICD-10-CM | POA: Insufficient documentation

## 2022-03-17 MED ORDER — ROSUVASTATIN CALCIUM 10 MG PO TABS: 10.0000 mg | ORAL_TABLET | Freq: Every day | ORAL | 3 refills | Status: DC

## 2022-03-23 ENCOUNTER — Inpatient Hospital Stay: Payer: Medicare PPO | Attending: Internal Medicine

## 2022-03-23 ENCOUNTER — Other Ambulatory Visit: Payer: Self-pay | Admitting: Hematology and Oncology

## 2022-03-23 ENCOUNTER — Inpatient Hospital Stay: Payer: Medicare PPO | Admitting: Hematology and Oncology

## 2022-03-23 DIAGNOSIS — D472 Monoclonal gammopathy: Secondary | ICD-10-CM

## 2022-03-28 ENCOUNTER — Other Ambulatory Visit: Payer: Medicare PPO

## 2022-03-28 ENCOUNTER — Ambulatory Visit: Payer: Medicare PPO | Admitting: Hematology and Oncology

## 2022-03-28 ENCOUNTER — Other Ambulatory Visit: Payer: Self-pay | Admitting: Internal Medicine

## 2022-05-02 ENCOUNTER — Telehealth: Payer: Self-pay | Admitting: Emergency Medicine

## 2022-05-02 ENCOUNTER — Other Ambulatory Visit: Payer: Self-pay | Admitting: Emergency Medicine

## 2022-05-02 DIAGNOSIS — I10 Essential (primary) hypertension: Secondary | ICD-10-CM

## 2022-05-02 MED ORDER — HYDROCHLOROTHIAZIDE 12.5 MG PO TABS: ORAL_TABLET | ORAL | 3 refills | Status: DC

## 2022-05-02 NOTE — Telephone Encounter (Signed)
Patient needs her hydrochlorathizide refilled - please send to [facility] on [address]  Patient;s:  [phone]

## 2022-05-02 NOTE — Telephone Encounter (Signed)
New prescription sent to pharmacy of record today.  Thanks.

## 2022-08-06 ENCOUNTER — Encounter: Payer: Medicare PPO | Admitting: Internal Medicine

## 2022-08-30 ENCOUNTER — Ambulatory Visit (INDEPENDENT_AMBULATORY_CARE_PROVIDER_SITE_OTHER): Payer: Medicare PPO

## 2022-08-30 VITALS — Ht 65.0 in | Wt 193.5 lb

## 2022-08-30 DIAGNOSIS — Z0000 Encounter for general adult medical examination without abnormal findings: Secondary | ICD-10-CM | POA: Diagnosis not present

## 2022-08-30 NOTE — Patient Instructions (Addendum)
Amy Serrano , Thank you for taking time to come for your Medicare Wellness Visit. I appreciate your ongoing commitment to your health goals. Please review the following plan we discussed and let me know if I can assist you in the future.   These are the goals we discussed:  Goals       Patient Stated (pt-stated)       Stay healthy!      Patient Stated      02/11/2020, stay healthy      Patient Stated      02/15/2021, stay healthy      Weight (lb) < 200 lb (90.7 kg) (pt-stated)        This is a list of the screening recommended for you and due dates:  Health Maintenance  Topic Date Due   COVID-19 Vaccine (5 - 2023-24 season) 09/15/2022*   Zoster (Shingles) Vaccine (1 of 2) 11/30/2022*   Pneumonia Vaccine (1 of 1 - PCV) 08/30/2023*   Flu Shot  10/04/2022   Medicare Annual Wellness Visit  08/30/2023   DEXA scan (bone density measurement)  Completed   Hepatitis C Screening  Completed   HPV Vaccine  Aged Out   DTaP/Tdap/Td vaccine  Discontinued   Colon Cancer Screening  Discontinued  *Topic was postponed. The date shown is not the original due date.    Advanced directives: Advance directive discussed with you today. Even though you declined this today, please call our office should you change your mind, and we can give you the proper paperwork for you to fill out.   Conditions/risks identified: None  Next appointment: Follow up in one year for your annual wellness visit    Preventive Care 65 Years and Older, Female Preventive care refers to lifestyle choices and visits with your health care provider that can promote health and wellness. What does preventive care include? A yearly physical exam. This is also called an annual well check. Dental exams once or twice a year. Routine eye exams. Ask your health care provider how often you should have your eyes checked. Personal lifestyle choices, including: Daily care of your teeth and gums. Regular physical activity. Eating a  healthy diet. Avoiding tobacco and drug use. Limiting alcohol use. Practicing safe sex. Taking low-dose aspirin every day. Taking vitamin and mineral supplements as recommended by your health care provider. What happens during an annual well check? The services and screenings done by your health care provider during your annual well check will depend on your age, overall health, lifestyle risk factors, and family history of disease. Counseling  Your health care provider may ask you questions about your: Alcohol use. Tobacco use. Drug use. Emotional well-being. Home and relationship well-being. Sexual activity. Eating habits. History of falls. Memory and ability to understand (cognition). Work and work Astronomer. Reproductive health. Screening  You may have the following tests or measurements: Height, weight, and BMI. Blood pressure. Lipid and cholesterol levels. These may be checked every 5 years, or more frequently if you are over 60 years old. Skin check. Lung cancer screening. You may have this screening every year starting at age 78 if you have a 30-pack-year history of smoking and currently smoke or have quit within the past 15 years. Fecal occult blood test (FOBT) of the stool. You may have this test every year starting at age 78. Flexible sigmoidoscopy or colonoscopy. You may have a sigmoidoscopy every 5 years or a colonoscopy every 10 years starting at age 65. Hepatitis C blood  test. Hepatitis B blood test. Sexually transmitted disease (STD) testing. Diabetes screening. This is done by checking your blood sugar (glucose) after you have not eaten for a while (fasting). You may have this done every 1-3 years. Bone density scan. This is done to screen for osteoporosis. You may have this done starting at age 78. Mammogram. This may be done every 1-2 years. Talk to your health care provider about how often you should have regular mammograms. Talk with your health care  provider about your test results, treatment options, and if necessary, the need for more tests. Vaccines  Your health care provider may recommend certain vaccines, such as: Influenza vaccine. This is recommended every year. Tetanus, diphtheria, and acellular pertussis (Tdap, Td) vaccine. You may need a Td booster every 10 years. Zoster vaccine. You may need this after age 20. Pneumococcal 13-valent conjugate (PCV13) vaccine. One dose is recommended after age 78. Pneumococcal polysaccharide (PPSV23) vaccine. One dose is recommended after age 788. Talk to your health care provider about which screenings and vaccines you need and how often you need them. This information is not intended to replace advice given to you by your health care provider. Make sure you discuss any questions you have with your health care provider. Document Released: 03/18/2015 Document Revised: 11/09/2015 Document Reviewed: 12/21/2014 Elsevier Interactive Patient Education  2017 [place] Prevention in the Home Falls can cause injuries. They can happen to people of all ages. There are many things you can do to make your home safe and to help prevent falls. What can I do on the outside of my home? Regularly fix the edges of walkways and driveways and fix any cracks. Remove anything that might make you trip as you walk through a door, such as a raised step or threshold. Trim any bushes or trees on the path to your home. Use bright outdoor lighting. Clear any walking paths of anything that might make someone trip, such as rocks or tools. Regularly check to see if handrails are loose or broken. Make sure that both sides of any steps have handrails. Any raised decks and porches should have guardrails on the edges. Have any leaves, snow, or ice cleared regularly. Use sand or salt on walking paths during winter. Clean up any spills in your garage right away. This includes oil or grease spills. What can I do in the  bathroom? Use night lights. Install grab bars by the toilet and in the tub and shower. Do not use towel bars as grab bars. Use non-skid mats or decals in the tub or shower. If you need to sit down in the shower, use a plastic, non-slip stool. Keep the floor dry. Clean up any water that spills on the floor as soon as it happens. Remove soap buildup in the tub or shower regularly. Attach bath mats securely with double-sided non-slip rug tape. Do not have throw rugs and other things on the floor that can make you trip. What can I do in the bedroom? Use night lights. Make sure that you have a light by your bed that is easy to reach. Do not use any sheets or blankets that are too big for your bed. They should not hang down onto the floor. Have a firm chair that has side arms. You can use this for support while you get dressed. Do not have throw rugs and other things on the floor that can make you trip. What can I do in the kitchen? Clean  up any spills right away. Avoid walking on wet floors. Keep items that you use a lot in easy-to-reach places. If you need to reach something above you, use a strong step stool that has a grab bar. Keep electrical cords out of the way. Do not use floor polish or wax that makes floors slippery. If you must use wax, use non-skid floor wax. Do not have throw rugs and other things on the floor that can make you trip. What can I do with my stairs? Do not leave any items on the stairs. Make sure that there are handrails on both sides of the stairs and use them. Fix handrails that are broken or loose. Make sure that handrails are as long as the stairways. Check any carpeting to make sure that it is firmly attached to the stairs. Fix any carpet that is loose or worn. Avoid having throw rugs at the top or bottom of the stairs. If you do have throw rugs, attach them to the floor with carpet tape. Make sure that you have a light switch at the top of the stairs and the  bottom of the stairs. If you do not have them, ask someone to add them for you. What else can I do to help prevent falls? Wear shoes that: Do not have high heels. Have rubber bottoms. Are comfortable and fit you well. Are closed at the toe. Do not wear sandals. If you use a stepladder: Make sure that it is fully opened. Do not climb a closed stepladder. Make sure that both sides of the stepladder are locked into place. Ask someone to hold it for you, if possible. Clearly mark and make sure that you can see: Any grab bars or handrails. First and last steps. Where the edge of each step is. Use tools that help you move around (mobility aids) if they are needed. These include: Canes. Walkers. Scooters. Crutches. Turn on the lights when you go into a dark area. Replace any light bulbs as soon as they burn out. Set up your furniture so you have a clear path. Avoid moving your furniture around. If any of your floors are uneven, fix them. If there are any pets around you, be aware of where they are. Review your medicines with your doctor. Some medicines can make you feel dizzy. This can increase your chance of falling. Ask your doctor what other things that you can do to help prevent falls. This information is not intended to replace advice given to you by your health care provider. Make sure you discuss any questions you have with your health care provider. Document Released: 12/16/2008 Document Revised: 07/28/2015 Document Reviewed: 03/26/2014 Elsevier Interactive Patient Education  2017 [employer].

## 2022-08-30 NOTE — Progress Notes (Cosign Needed)
Subjective:   Amy Serrano is a 77 y.o. female who presents for Medicare Annual (Subsequent) preventive examination.  Visit Complete: Virtual  I connected with  [name] on 08/30/22 by a audio enabled telemedicine application and verified that I am speaking with the correct person using two identifiers.  Patient Location: Home  Provider Location: Home Office  I discussed the limitations of evaluation and management by telemedicine. The patient expressed understanding and agreed to proceed.  Patient Medicare AWV questionnaire was completed by the patient on ; I have confirmed that all information answered by patient is correct and no changes since this date.  Review of Systems     Cardiac Risk Factors include: advanced age (>20men, >36 women);hypertension     Objective:    Today's Vitals   08/30/22 1438  Weight: 193 lb 8 oz (87.8 kg)  Height: 5\' 5"  (1.651 m)   Body mass index is 32.2 kg/m.     08/30/2022    2:46 PM 09/11/2021    9:23 AM 02/15/2021    9:42 AM 02/11/2020   10:01 AM 11/26/2018   10:47 AM 01/01/2018    8:52 AM 08/05/2015    7:59 PM  Advanced Directives  Does Patient Have a Medical Advance Directive? No No No No No Yes No  Type of Advance Directive      Healthcare Power of Attorney   Does patient want to make changes to medical advance directive?      No - Patient declined   Copy of Healthcare Power of Attorney in Chart?      No - copy requested   Would patient like information on creating a medical advance directive? No - Patient declined No - Patient declined No - Patient declined No - Patient declined   No - patient declined information    Current Medications (verified) Outpatient Encounter Medications as of 08/30/2022  Medication Sig   amLODipine (NORVASC) 10 MG tablet Take 1 tablet (10 mg total) by mouth daily.   aspirin 81 MG tablet Take 81 mg by mouth at bedtime.    Cholecalciferol (VITAMIN D3) 125 MCG (5000 UT) CAPS Take 1 capsule by  mouth daily.   EPINEPHrine 0.3 mg/0.3 mL IJ SOAJ injection Inject 0.3 mg into the muscle as needed for anaphylaxis.   hydrochlorothiazide (HYDRODIURIL) 12.5 MG tablet One tab po qd   Multiple Vitamin (MULTIVITAMIN) tablet Take 1 tablet by mouth daily.   Omega-3 Fatty Acids (OMEGA 3 PO) Take 1 tablet by mouth daily at 12 noon.   rosuvastatin (CRESTOR) 10 MG tablet Take 1 tablet (10 mg total) by mouth daily.   [DISCONTINUED] amLODipine-benazepril (LOTREL) 10-20 MG per capsule Take 1 capsule by mouth daily.   [DISCONTINUED] dicyclomine (BENTYL) 10 MG capsule Take 2 capsules (20 mg total) by mouth 3 (three) times daily between meals as needed for spasms. (Patient not taking: Reported on 08/05/2015)   [DISCONTINUED] famotidine (PEPCID) 20 MG tablet Take 1 tablet (20 mg total) by mouth 2 (two) times daily. (Patient not taking: Reported on 08/05/2015)   [DISCONTINUED] metoCLOPramide (REGLAN) 10 MG tablet Take 1 tablet (10 mg total) by mouth every 6 (six) hours as needed for nausea (nausea/headache). (Patient not taking: Reported on 08/05/2015)   No facility-administered encounter medications on file as of 08/30/2022.    Allergies (verified) Benazepril, Justicia adhatoda (malabar nut tree) [justicia adhatoda], Ace inhibitors, and Peanut butter flavor   History: Past Medical History:  Diagnosis Date   Endometriosis  Hyperlipidemia    Hypertension    Past Surgical History:  Procedure Laterality Date   arm surgery     left   Family History  Problem Relation Age of Onset   Hypertension Mother    Heart attack Father    Asthma Brother    Hypertension Other    Stroke Other    Social History   Socioeconomic History   Marital status: Widowed    Spouse name: Not on file   Number of children: Not on file   Years of education: Not on file   Highest education level: Not on file  Occupational History   Occupation: retired  Tobacco Use   Smoking status: Never   Smokeless tobacco: Never  Vaping  Use   Vaping Use: Never used  Substance and Sexual Activity   Alcohol use: No   Drug use: No   Sexual activity: Not Currently  Other Topics Concern   Not on file  Social History Narrative   Not on file   Social Determinants of Health   Financial Resource Strain: Low Risk  (08/30/2022)   Overall Financial Resource Strain (CARDIA)    Difficulty of Paying Living Expenses: Not hard at all  Food Insecurity: No Food Insecurity (08/30/2022)   Hunger Vital Sign    Worried About Running Out of Food in the Last Year: Never true    Ran Out of Food in the Last Year: Never true  Transportation Needs: No Transportation Needs (08/30/2022)   PRAPARE - Administrator, Civil Service (Medical): No    Lack of Transportation (Non-Medical): No  Physical Activity: Sufficiently Active (08/30/2022)   Exercise Vital Sign    Days of Exercise per Week: 7 days    Minutes of Exercise per Session: 30 min  Stress: No Stress Concern Present (08/30/2022)   [employer] of Occupational Health - Occupational Stress Questionnaire    Feeling of Stress : Not at all  Social Connections: Moderately Integrated (08/30/2022)   Social Connection and Isolation Panel [NHANES]    Frequency of Communication with Friends and Family: More than three times a week    Frequency of Social Gatherings with Friends and Family: More than three times a week    Attends Religious Services: More than 4 times per year    Active Member of [employer] or Organizations: Yes    Attends Banker Meetings: More than 4 times per year    Marital Status: Widowed    Tobacco Counseling Counseling given: Not Answered   Clinical Intake:  Pre-visit preparation completed: No  Pain : No/denies pain     BMI - recorded: 32.2 Nutritional Status: BMI > 30  Obese Nutritional Risks: None Diabetes: No  How often do you need to have someone help you when you read instructions, pamphlets, or other written materials from your  doctor or pharmacy?: 1 - Never  Interpreter Needed?: No  Information entered by :: Theresa Mulligan LPN   Activities of Daily Living    08/30/2022    2:45 PM  In your present state of health, do you have any difficulty performing the following activities:  Hearing? 0  Vision? 0  Difficulty concentrating or making decisions? 0  Walking or climbing stairs? 0  Dressing or bathing? 0  Doing errands, shopping? 0  Preparing Food and eating ? N  Using the Toilet? N  In the past six months, have you accidently leaked urine? N  Do you have problems with loss of  bowel control? N  Managing your Medications? N  Managing your Finances? N  Housekeeping or managing your Housekeeping? N    Patient Care Team: Georgina Quint, MD as PCP - General (Internal Medicine) Chalmers Guest, MD as Consulting Physician (Ophthalmology)  Indicate any recent Medical Services you may have received from other than [facility] providers in the past year (date may be approximate).     Assessment:   This is a routine wellness examination for [facility].  Hearing/Vision screen Hearing Screening - Comments:: Denies hearing difficulties   Vision Screening - Comments:: Wears rx glasses - up to date with routine eye exams with  Dr Ruel Favors  Dietary issues and exercise activities discussed:     Goals Addressed               This Visit's Progress     Patient Stated (pt-stated)         Stay healthy!       Depression Screen    08/30/2022    2:44 PM 03/15/2022    2:28 PM 10/19/2021   10:27 AM 02/15/2021    9:43 AM 02/15/2021    9:03 AM 02/11/2020   10:02 AM 07/29/2019   11:15 AM  PHQ 2/9 Scores  PHQ - 2 Score 0 0 0 0 0 0 0    Fall Risk    08/30/2022    2:46 PM 03/15/2022    2:28 PM 10/19/2021   10:27 AM 02/15/2021    9:43 AM 02/15/2021    9:03 AM  Fall Risk   Falls in the past year? 0 0 0 0 0  Number falls in past yr: 0 0 0  0  Injury with Fall? 0 0 0  0  Risk for fall due to : No Fall Risks No  Fall Risks No Fall Risks Medication side effect   Follow up Falls prevention discussed Falls evaluation completed Falls evaluation completed Falls evaluation completed;Education provided;Falls prevention discussed     MEDICARE RISK AT HOME:  Medicare Risk at Home - 08/30/22 1452     Any stairs in or around the home? Yes    If so, are there any without handrails? No    Home free of loose throw rugs in walkways, pet beds, electrical cords, etc? Yes    Adequate lighting in your home to reduce risk of falls? Yes    Life alert? No    Use of a cane, walker or w/c? No    Grab bars in the bathroom? No    Shower chair or bench in shower? No    Elevated toilet seat or a handicapped toilet? No             TIMED UP AND GO:  Was the test performed?  No    Cognitive Function:        08/30/2022    2:46 PM 02/15/2021    9:44 AM 02/11/2020   10:03 AM 11/26/2018   10:50 AM 01/01/2018    8:56 AM  6CIT Screen  What Year? 0 points 0 points 0 points 0 points 0 points  What month? 0 points 0 points 0 points 0 points 0 points  What time? 0 points 3 points 0 points 0 points 0 points  Count back from 20 0 points 0 points 0 points 0 points 0 points  Months in reverse 0 points 2 points 0 points 0 points 2 points  Repeat phrase 0 points 4 points 4 points 2 points 0 points  Total Score 0 points 9 points 4 points 2 points 2 points    Immunizations Immunization History  Administered Date(s) Administered   Fluad Quad(high Dose 65+) 02/11/2020   Influenza, High Dose Seasonal PF 12/02/2017, 11/26/2018   Influenza-Unspecified 12/03/2013, 11/28/2017, 01/23/2021   PFIZER(Purple Top)SARS-COV-2 Vaccination 04/20/2019, 05/12/2019, 12/07/2019, 12/16/2020        Pneumococcal vaccine status: Due, Education has been provided regarding the importance of this vaccine. Advised may receive this vaccine at local pharmacy or Health Dept. Aware to provide a copy of the vaccination record if obtained from local  pharmacy or Health Dept. Verbalized acceptance and understanding.  Covid-19 vaccine status: Completed vaccines  Qualifies for Shingles Vaccine? Yes   Zostavax completed No   Shingrix Completed?: No.    Education has been provided regarding the importance of this vaccine. Patient has been advised to call insurance company to determine out of pocket expense if they have not yet received this vaccine. Advised may also receive vaccine at local pharmacy or Health Dept. Verbalized acceptance and understanding.  Screening Tests Health Maintenance  Topic Date Due   COVID-19 Vaccine (5 - 2023-24 season) 09/15/2022 (Originally 11/03/2021)   Zoster Vaccines- Shingrix (1 of 2) 11/30/2022 (Originally 08/10/1964)   Pneumonia Vaccine 22+ Years old (1 of 1 - PCV) 08/30/2023 (Originally 08/09/2009)   INFLUENZA VACCINE  10/04/2022   Medicare Annual Wellness (AWV)  08/30/2023   DEXA SCAN  Completed   Hepatitis C Screening  Completed   HPV VACCINES  Aged Out   DTaP/Tdap/Td  Discontinued   Colonoscopy  Discontinued    Health Maintenance  There are no preventive care reminders to display for this patient.   Colorectal cancer screening: No longer required.   Mammogram status: No longer required due to Age.  Bone Density status: Completed 01/03/19. Results reflect: Bone density results: OSTEOPOROSIS. Repeat every   years.  Lung Cancer Screening: (Low Dose CT Chest recommended if Age 38-80 years, 20 pack-year currently smoking OR have quit w/in 15years.) does not qualify.     Additional Screening:  Hepatitis C Screening: does qualify; Completed 06/19/18  Vision Screening: Recommended annual ophthalmology exams for early detection of glaucoma and other disorders of the eye. Is the patient up to date with their annual eye exam?  Yes  Who is the provider or what is the name of the office in which the patient attends annual eye exams? Dr Clare Gandy If pt is not established with a provider, would they like to  be referred to a provider to establish care? No .   Dental Screening: Recommended annual dental exams for proper oral hygiene    Community Resource Referral / Chronic Care Management:  CRR required this visit?  No   CCM required this visit?  No     Plan:     I have personally reviewed and noted the following in the patient's chart:   Medical and social history Use of alcohol, tobacco or illicit drugs  Current medications and supplements including opioid prescriptions. Patient is not currently taking opioid prescriptions. Functional ability and status Nutritional status Physical activity Advanced directives List of other physicians Hospitalizations, surgeries, and ER visits in previous 12 months Vitals Screenings to include cognitive, depression, and falls Referrals and appointments  In addition, I have reviewed and discussed with patient certain preventive protocols, quality metrics, and best practice recommendations. A written personalized care plan for preventive services as well as general preventive health recommendations were provided to patient.     Meriam Sprague  Kandis Fantasia, LPN   [id number]   After Visit Summary: (MyChart) Due to this being a telephonic visit, the after visit summary with patients personalized plan was offered to patient via MyChart   Nurse Notes: None

## 2022-09-12 IMAGING — MG MM DIGITAL SCREENING BILAT W/ TOMO AND CAD
6 of 12 series · 6 of 36 positions shown · non-contrast
Comparison: Previous exam(s).

CLINICAL DATA: Screening.

EXAM:
DIGITAL SCREENING BILATERAL MAMMOGRAM WITH TOMOSYNTHESIS AND CAD
TECHNIQUE: Bilateral screening digital craniocaudal and mediolateral oblique
mammograms were obtained. Bilateral screening digital breast
tomosynthesis was performed. The images were evaluated with
computer-aided detection.

[L CC synth-2D (1 of 2)]
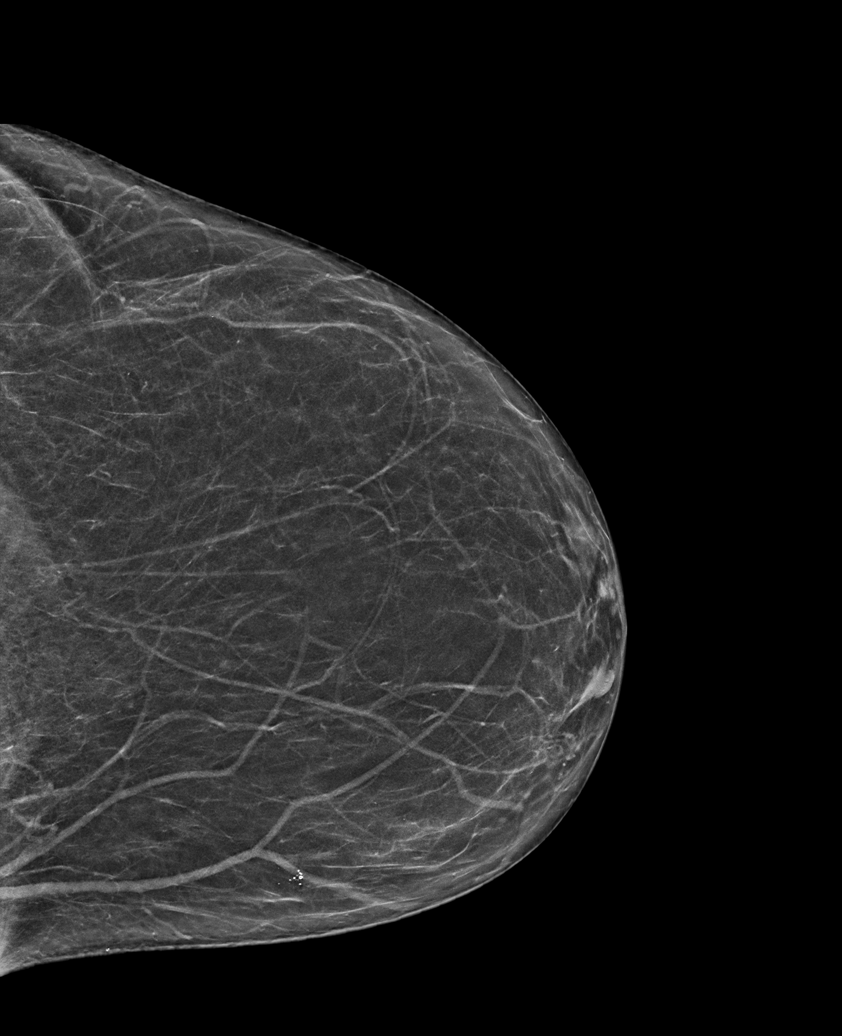

[L CC synth-2D (2 of 2)]
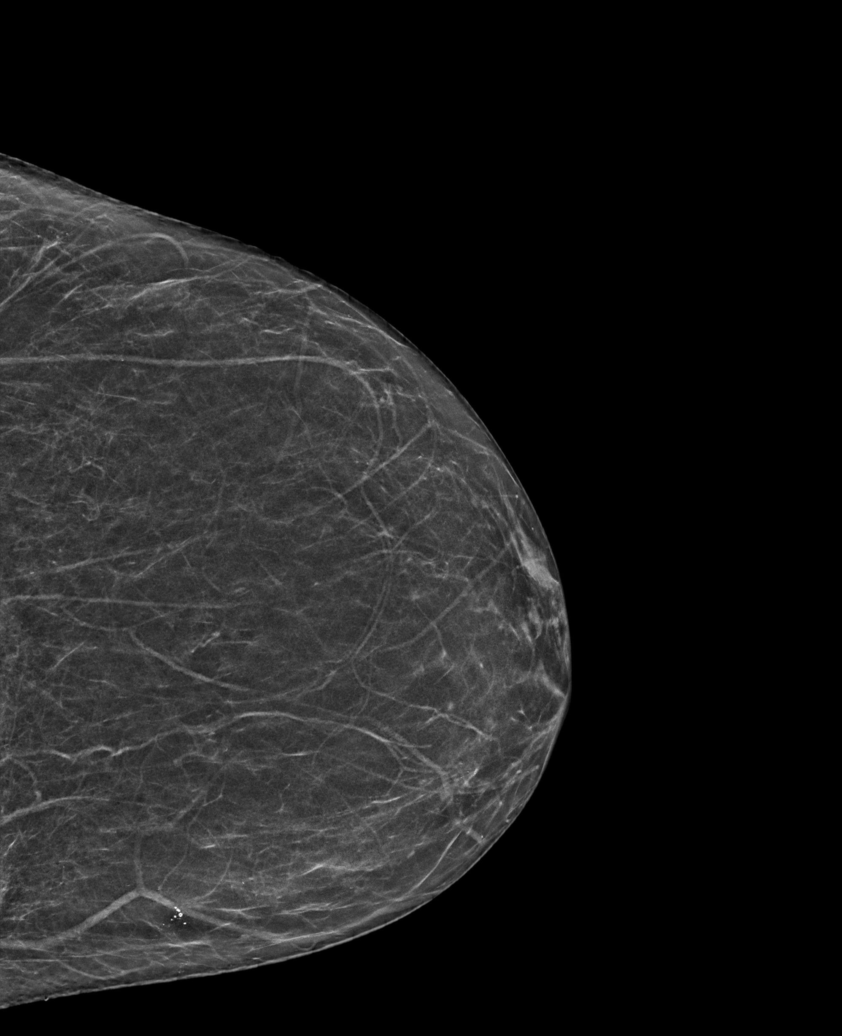

[L MLO synth-2D]
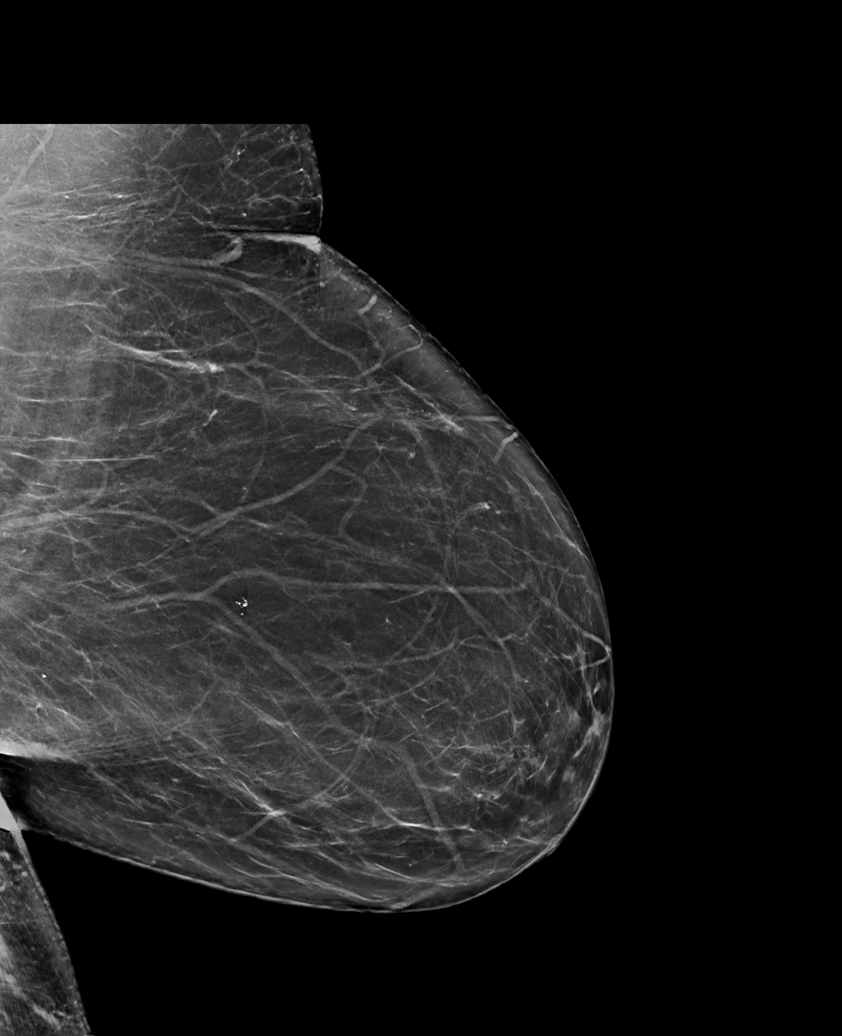

[R CC synth-2D (1 of 2)]
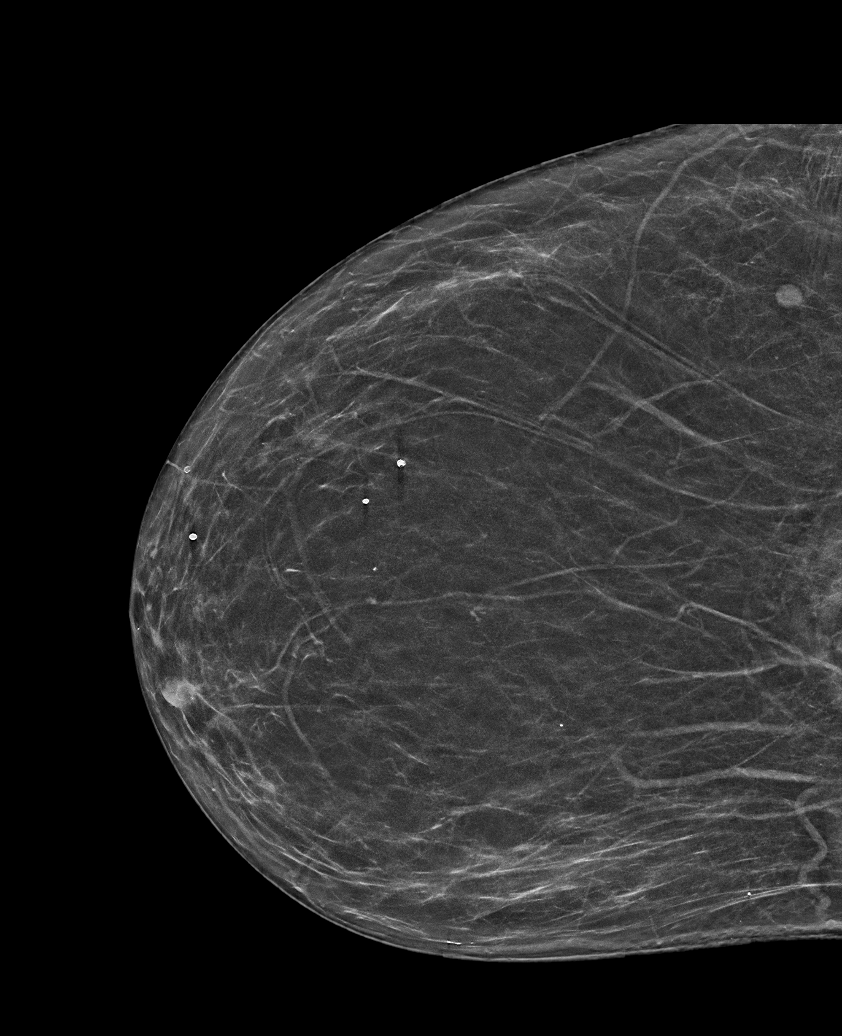

[R CC synth-2D (2 of 2)]
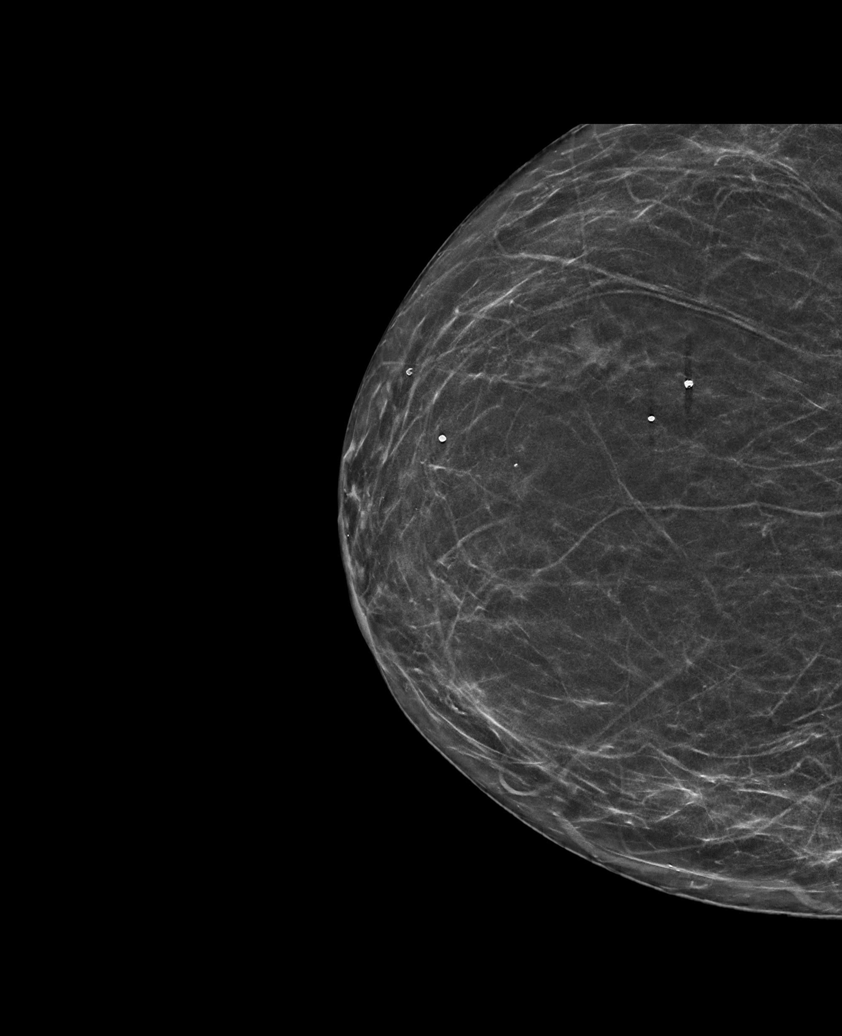

[R MLO synth-2D]
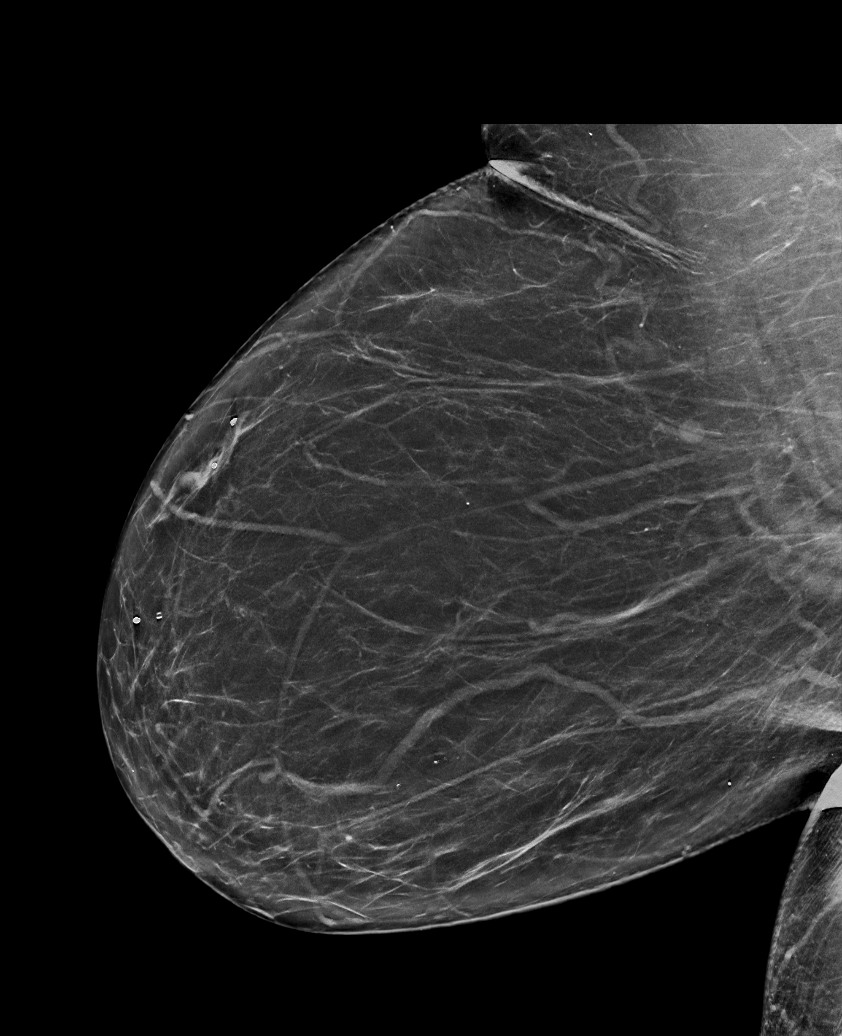

[6 of 36 positions shown; findings below may reference images not displayed]

ACR Breast Density Category b: There are scattered areas of
fibroglandular density.
FINDINGS: There are no findings suspicious for malignancy.
IMPRESSION: No mammographic evidence of malignancy. A result letter of this
screening mammogram will be mailed directly to the patient.

RECOMMENDATION:
Screening mammogram in one year. (Code:51-O-LD2)

BI-RADS CATEGORY  1: Negative.

## 2022-10-22 ENCOUNTER — Telehealth: Payer: Self-pay | Admitting: Emergency Medicine

## 2022-10-22 MED ORDER — AMLODIPINE BESYLATE 10 MG PO TABS: 10.0000 mg | ORAL_TABLET | Freq: Every day | ORAL | 0 refills | Status: DC

## 2022-10-22 NOTE — Telephone Encounter (Signed)
Sent 30 day supply to [facility] until appt..Gordan Payment

## 2022-10-22 NOTE — Telephone Encounter (Signed)
Prescription Request  10/22/2022  LOV: 03/15/2022  What is the name of the medication or equipment? mLODipine (NORVASC) 10 MG tablet [   Have you contacted your pharmacy to request a refill? No   Which pharmacy would you like this sent to?  [id number] - [place], [place] - [address] AT [facility] [address] [place] [address] Phone: [phone] Fax: [phone]    Patient notified that their request is being sent to the clinical staff for review and that they should receive a response within 2 business days.   Please advise at Mobile [phone] (mobile)

## 2022-10-25 ENCOUNTER — Telehealth: Payer: Self-pay | Admitting: Emergency Medicine

## 2022-10-25 ENCOUNTER — Other Ambulatory Visit: Payer: Self-pay | Admitting: Emergency Medicine

## 2022-10-25 MED ORDER — SCOPOLAMINE 1 MG/3DAYS TD PT72: 1.0000 | MEDICATED_PATCH | TRANSDERMAL | 1 refills | Status: DC

## 2022-10-25 NOTE — Telephone Encounter (Signed)
Called patient and informed her that medication was sent to her pharmacy.

## 2022-10-25 NOTE — Telephone Encounter (Signed)
Patient called and said she is going on a cruise soon. She said she suffers from vertigo and is worried about a possible flare up when on a moving ship. She would like to know if Dr. Alvy Bimler would recommend something to help just in case. Best callback is [phone] or [phone].

## 2022-10-25 NOTE — Telephone Encounter (Signed)
Prescription for Transderm Scop sent to pharmacy of record today.  Thanks.

## 2022-11-26 ENCOUNTER — Other Ambulatory Visit: Payer: Self-pay | Admitting: Emergency Medicine

## 2022-12-31 ENCOUNTER — Encounter: Payer: Self-pay | Admitting: Emergency Medicine

## 2022-12-31 ENCOUNTER — Ambulatory Visit (INDEPENDENT_AMBULATORY_CARE_PROVIDER_SITE_OTHER): Payer: Medicare PPO | Admitting: Emergency Medicine

## 2022-12-31 VITALS — BP 134/70 | HR 73 | Temp 98.2°F | Ht 65.0 in | Wt 196.2 lb

## 2022-12-31 DIAGNOSIS — Z0000 Encounter for general adult medical examination without abnormal findings: Secondary | ICD-10-CM | POA: Diagnosis not present

## 2022-12-31 DIAGNOSIS — E785 Hyperlipidemia, unspecified: Secondary | ICD-10-CM

## 2022-12-31 DIAGNOSIS — Z0001 Encounter for general adult medical examination with abnormal findings: Secondary | ICD-10-CM

## 2022-12-31 DIAGNOSIS — N1831 Chronic kidney disease, stage 3a: Secondary | ICD-10-CM

## 2022-12-31 DIAGNOSIS — Z13228 Encounter for screening for other metabolic disorders: Secondary | ICD-10-CM | POA: Diagnosis not present

## 2022-12-31 DIAGNOSIS — I10 Essential (primary) hypertension: Secondary | ICD-10-CM | POA: Diagnosis not present

## 2022-12-31 DIAGNOSIS — Z130 Encounter for screening for diseases of the blood and blood-forming organs and certain disorders involving the immune mechanism: Secondary | ICD-10-CM

## 2022-12-31 DIAGNOSIS — Z1329 Encounter for screening for other suspected endocrine disorder: Secondary | ICD-10-CM | POA: Diagnosis not present

## 2022-12-31 LAB — COMPREHENSIVE METABOLIC PANEL
ALT: 15 U/L (ref 0–35)
AST: 19 U/L (ref 0–37)
Albumin: 4.2 g/dL (ref 3.5–5.2)
Alkaline Phosphatase: 70 U/L (ref 39–117)
BUN: 19 mg/dL (ref 6–23)
CO2: 29 meq/L (ref 19–32)
Calcium: 9.8 mg/dL (ref 8.4–10.5)
Chloride: 102 meq/L (ref 96–112)
Creatinine, Ser: 0.95 mg/dL (ref 0.40–1.20)
GFR: 57.43 mL/min — ABNORMAL LOW (ref >60.00)
Glucose, Bld: 101 mg/dL — ABNORMAL HIGH (ref 70–99)
Potassium: 4.6 meq/L (ref 3.5–5.1)
Sodium: 139 meq/L (ref 135–145)
Total Bilirubin: 0.3 mg/dL (ref 0.2–1.2)
Total Protein: 7.8 g/dL (ref 6.0–8.3)

## 2022-12-31 LAB — CBC WITH DIFFERENTIAL/PLATELET
Basophils Absolute: 0 10*3/uL (ref 0.0–0.1)
Basophils Relative: 0.5 % (ref 0.0–3.0)
Eosinophils Absolute: 0.1 10*3/uL (ref 0.0–0.7)
Eosinophils Relative: 1.3 % (ref 0.0–5.0)
HCT: 39.8 % (ref 36.0–46.0)
Hemoglobin: 12.7 g/dL (ref 12.0–15.0)
Lymphocytes Relative: 20.6 % (ref 12.0–46.0)
Lymphs Abs: 1.5 10*3/uL (ref 0.7–4.0)
MCHC: 31.8 g/dL (ref 30.0–36.0)
MCV: 87.3 fL (ref 78.0–100.0)
Monocytes Absolute: 0.8 10*3/uL (ref 0.1–1.0)
Monocytes Relative: 10.6 % (ref 3.0–12.0)
Neutro Abs: 5 10*3/uL (ref 1.4–7.7)
Neutrophils Relative %: 67 % (ref 43.0–77.0)
Platelets: 320 10*3/uL (ref 150.0–400.0)
RBC: 4.56 Mil/uL (ref 3.87–5.11)
RDW: 14.6 % (ref 11.5–15.5)
WBC: 7.4 10*3/uL (ref 4.0–10.5)

## 2022-12-31 LAB — LIPID PANEL
Cholesterol: 158 mg/dL (ref 0–200)
HDL: 54.8 mg/dL (ref >39.00)
LDL Cholesterol: 78 mg/dL (ref 0–99)
NonHDL: 103.06
Total CHOL/HDL Ratio: 3
Triglycerides: 127 mg/dL (ref 0.0–149.0)
VLDL: 25.4 mg/dL (ref 0.0–40.0)

## 2022-12-31 LAB — HEMOGLOBIN A1C: Hgb A1c MFr Bld: 6.4 % (ref 4.6–6.5)

## 2022-12-31 NOTE — Patient Instructions (Signed)

## 2022-12-31 NOTE — Progress Notes (Signed)
Amy Serrano 77 y.o.   Chief Complaint  Patient presents with   Annual Exam    Annual Exam    HISTORY OF PRESENT ILLNESS: This is a 77 y.o. female here for annual exam and follow-up of chronic medical conditions including hypertension and dyslipidemia Overall doing well.  Has no complaints or medical concerns today.  HPI   Prior to Admission medications   Medication Sig Start Date End Date Taking? Authorizing Provider  amLODipine (NORVASC) 10 MG tablet Take 1 tablet (10 mg total) by mouth daily. 11/26/22  Yes SagardiaEilleen Kempf, MD  aspirin 81 MG tablet Take 81 mg by mouth at bedtime.    Yes [provider]  Cholecalciferol (VITAMIN D3) 125 MCG (5000 UT) CAPS Take 1 capsule by mouth daily.   Yes [provider]  hydrochlorothiazide (HYDRODIURIL) 12.5 MG tablet One tab po qd 05/02/22  Yes Jonta Gastineau, Eilleen Kempf, MD  Multiple Vitamin (MULTIVITAMIN) tablet Take 1 tablet by mouth daily.   Yes [provider]  Omega-3 Fatty Acids (OMEGA 3 PO) Take 1 tablet by mouth daily at 12 noon.   Yes [provider]  rosuvastatin (CRESTOR) 10 MG tablet Take 1 tablet (10 mg total) by mouth daily. 03/17/22  Yes Totiana Everson, Eilleen Kempf, MD  EPINEPHrine 0.3 mg/0.3 mL IJ SOAJ injection Inject 0.3 mg into the muscle as needed for anaphylaxis. Patient not taking: Reported on 12/31/2022 02/06/21   Cheryll Cockayne, MD  scopolamine (TRANSDERM SCOP, 1.5 MG,) 1 MG/3DAYS Place 1 patch (1.5 mg total) onto the skin every 3 (three) days. Patient not taking: Reported on 12/31/2022 10/25/22   Georgina Quint, MD  amLODipine-benazepril (LOTREL) 10-20 MG per capsule Take 1 capsule by mouth daily.  08/07/14  [provider]  dicyclomine (BENTYL) 10 MG capsule Take 2 capsules (20 mg total) by mouth 3 (three) times daily between meals as needed for spasms. Patient not taking: Reported on 08/05/2015 08/01/14 08/05/15  Doug Sou, MD  famotidine (PEPCID) 20 MG tablet Take 1  tablet (20 mg total) by mouth 2 (two) times daily. Patient not taking: Reported on 08/05/2015 08/07/14 08/05/15  Eber Hong, MD  metoCLOPramide (REGLAN) 10 MG tablet Take 1 tablet (10 mg total) by mouth every 6 (six) hours as needed for nausea (nausea/headache). Patient not taking: Reported on 08/05/2015 08/01/14 08/05/15  Doug Sou, MD    Allergies  Allergen Reactions   Benazepril Swelling    Swelling of the face    Justicia Adhatoda ([place] Nut Tree) Keitha [place] Adhatoda] Anaphylaxis   Ace Inhibitors     Angioedema   Peanut Butter Flavor     Patient Active Problem List   Diagnosis Date Noted   Dyslipidemia 03/17/2022   Vertigo 03/15/2022   Stage 3a chronic kidney disease (HCC) 10/19/2021   Essential hypertension 10/19/2021   Hypertensive nephropathy 01/01/2018   Chronic renal disease, stage II 01/01/2018   Other abnormal glucose 01/01/2018   Class 2 drug-induced obesity with serious comorbidity and body mass index (BMI) of 35.0 to 35.9 in adult 01/01/2018    Past Medical History:  Diagnosis Date   Endometriosis    Hyperlipidemia    Hypertension     Past Surgical History:  Procedure Laterality Date   arm surgery     left    Social History   Socioeconomic History   Marital status: Widowed    Spouse name: Not on file   Number of children: Not on file   Years of education: Not on  file   Highest education level: Not on file  Occupational History   Occupation: retired  Tobacco Use   Smoking status: Never   Smokeless tobacco: Never  Vaping Use   Vaping status: Never Used  Substance and Sexual Activity   Alcohol use: No   Drug use: No   Sexual activity: Not Currently  Other Topics Concern   Not on file  Social History Narrative   Not on file   Social Determinants of Health   Financial Resource Strain: Low Risk  (08/30/2022)   Overall Financial Resource Strain (CARDIA)    Difficulty of Paying Living Expenses: Not hard at all  Food Insecurity: No Food  Insecurity (08/30/2022)   Hunger Vital Sign    Worried About Running Out of Food in the Last Year: Never true    Ran Out of Food in the Last Year: Never true  Transportation Needs: No Transportation Needs (08/30/2022)   PRAPARE - Administrator, Civil Service (Medical): No    Lack of Transportation (Non-Medical): No  Physical Activity: Sufficiently Active (08/30/2022)   Exercise Vital Sign    Days of Exercise per Week: 7 days    Minutes of Exercise per Session: 30 min  Stress: No Stress Concern Present (08/30/2022)   [employer] of Occupational Health - Occupational Stress Questionnaire    Feeling of Stress : Not at all  Social Connections: Moderately Integrated (08/30/2022)   Social Connection and Isolation Panel [NHANES]    Frequency of Communication with Friends and Family: More than three times a week    Frequency of Social Gatherings with Friends and Family: More than three times a week    Attends Religious Services: More than 4 times per year    Active Member of [employer] or Organizations: Yes    Attends Banker Meetings: More than 4 times per year    Marital Status: Widowed  Intimate Partner Violence: Not At Risk (08/30/2022)   Humiliation, Afraid, Rape, and Kick questionnaire    Fear of Current or Ex-Partner: No    Emotionally Abused: No    Physically Abused: No    Sexually Abused: No    Family History  Problem Relation Age of Onset   Hypertension Mother    Heart attack Father    Asthma Brother    Hypertension Other    Stroke Other      Review of Systems  Constitutional: Negative.  Negative for chills and fever.  HENT: Negative.  Negative for congestion and sore throat.   Respiratory: Negative.  Negative for cough and shortness of breath.   Cardiovascular: Negative.  Negative for chest pain and palpitations.  Gastrointestinal:  Negative for abdominal pain, diarrhea, nausea and vomiting.  Genitourinary: Negative.  Negative for dysuria and  hematuria.  Skin: Negative.  Negative for rash.  Neurological: Negative.  Negative for dizziness and headaches.  All other systems reviewed and are negative.   Vitals:   12/31/22 1329  BP: 134/70  Pulse: 73  Temp: 98.2 F (36.8 C)  SpO2: 97%    Physical Exam Vitals reviewed.  Constitutional:      Appearance: Normal appearance.  HENT:     Head: Normocephalic.     Right Ear: Tympanic membrane, ear canal and external ear normal.     Left Ear: Tympanic membrane, ear canal and external ear normal.     Mouth/Throat:     Mouth: Mucous membranes are moist.     Pharynx: Oropharynx is clear.  Eyes:     Extraocular Movements: Extraocular movements intact.     Conjunctiva/sclera: Conjunctivae normal.     Pupils: Pupils are equal, round, and reactive to light.  Cardiovascular:     Rate and Rhythm: Normal rate and regular rhythm.     Pulses: Normal pulses.     Heart sounds: Normal heart sounds.  Pulmonary:     Effort: Pulmonary effort is normal.     Breath sounds: Normal breath sounds.  Abdominal:     Palpations: Abdomen is soft.     Tenderness: There is no abdominal tenderness.  Musculoskeletal:     Cervical back: No tenderness.     Right lower leg: No edema.     Left lower leg: No edema.  Lymphadenopathy:     Cervical: No cervical adenopathy.  Skin:    General: Skin is warm and dry.     Capillary Refill: Capillary refill takes less than 2 seconds.  Neurological:     General: No focal deficit present.     Mental Status: She is alert and oriented to person, place, and time.  Psychiatric:        Mood and Affect: Mood normal.        Behavior: Behavior normal.      ASSESSMENT & PLAN: Problem List Items Addressed This Visit       Cardiovascular and Mediastinum   Essential hypertension    BP Readings from Last 3 Encounters:  12/31/22 134/70  03/15/22 (!) 142/88  10/19/21 128/72  Well-controlled hypertension Continue amlodipine 10 mg daily and hydrochlorothiazide  12.5 mg daily Cardiovascular risks associated with hypertension discussed Dietary approaches to stop hypertension discussed       Relevant Orders   Comprehensive metabolic panel     Genitourinary   Stage 3a chronic kidney disease (HCC)    Advised to stay well-hydrated and avoid NSAIDs      Relevant Orders   Comprehensive metabolic panel     Other   Dyslipidemia    Chronic stable condition Rosuvastatin 10 mg daily Diet and nutrition discussed The 10-year ASCVD risk score (Arnett DK, et al., 2019) is: 41.7%   Values used to calculate the score:     Age: 73 years     Sex: Female     Is Non-Hispanic African American: Yes     Diabetic: Yes     Tobacco smoker: No     Systolic Blood Pressure: 134 mmHg     Is BP treated: Yes     HDL Cholesterol: 50.3 mg/dL     Total Cholesterol: 234 mg/dL       Relevant Orders   Hemoglobin A1c   Lipid panel   Other Visit Diagnoses     Encounter for general adult medical examination with abnormal findings    -  Primary   Relevant Orders   CBC with Differential   Comprehensive metabolic panel   Hemoglobin A1c   Lipid panel   Screening for deficiency anemia       Relevant Orders   CBC with Differential   Screening for endocrine, metabolic and immunity disorder       Relevant Orders   Comprehensive metabolic panel   Hemoglobin A1c      Modifiable risk factors discussed with patient. Anticipatory guidance according to age provided. The following topics were also discussed: Social Determinants of Health Smoking.  Non-smoker Diet and nutrition.  Good eating habits Benefits of exercise Cancer screening and review of most recent mammogram report Vaccinations  reviewed and recommendations Cardiovascular risk assessment The 10-year ASCVD risk score (Arnett DK, et al., 2019) is: 41.7%   Values used to calculate the score:     Age: 59 years     Sex: Female     Is Non-Hispanic African American: Yes     Diabetic: Yes     Tobacco  smoker: No     Systolic Blood Pressure: 134 mmHg     Is BP treated: Yes     HDL Cholesterol: 50.3 mg/dL     Total Cholesterol: 234 mg/dL Review of chronic medical conditions and all medications Mental health including depression and anxiety Fall and accident prevention  Patient Instructions  Health Maintenance, Female Adopting a healthy lifestyle and getting preventive care are important in promoting health and wellness. Ask your health care provider about: The right schedule for you to have regular tests and exams. Things you can do on your own to prevent diseases and keep yourself healthy. What should I know about diet, weight, and exercise? Eat a healthy diet  Eat a diet that includes plenty of vegetables, fruits, low-fat dairy products, and lean protein. Do not eat a lot of foods that are high in solid fats, added sugars, or sodium. Maintain a healthy weight Body mass index (BMI) is used to identify weight problems. It estimates body fat based on height and weight. Your health care provider can help determine your BMI and help you achieve or maintain a healthy weight. Get regular exercise Get regular exercise. This is one of the most important things you can do for your health. Most adults should: Exercise for at least 150 minutes each week. The exercise should increase your heart rate and make you sweat (moderate-intensity exercise). Do strengthening exercises at least twice a week. This is in addition to the moderate-intensity exercise. Spend less time sitting. Even light physical activity can be beneficial. Watch cholesterol and blood lipids Have your blood tested for lipids and cholesterol at 78 years of age, then have this test every 5 years. Have your cholesterol levels checked more often if: Your lipid or cholesterol levels are high. You are older than 78 years of age. You are at high risk for heart disease. What should I know about cancer screening? Depending on your  health history and family history, you may need to have cancer screening at various ages. This may include screening for: Breast cancer. Cervical cancer. Colorectal cancer. Skin cancer. Lung cancer. What should I know about heart disease, diabetes, and high blood pressure? Blood pressure and heart disease High blood pressure causes heart disease and increases the risk of stroke. This is more likely to develop in people who have high blood pressure readings or are overweight. Have your blood pressure checked: Every 3-5 years if you are 58-72 years of age. Every year if you are 59 years old or older. Diabetes Have regular diabetes screenings. This checks your fasting blood sugar level. Have the screening done: Once every three years after age 76 if you are at a normal weight and have a low risk for diabetes. More often and at a younger age if you are overweight or have a high risk for diabetes. What should I know about preventing infection? Hepatitis B If you have a higher risk for hepatitis B, you should be screened for this virus. Talk with your health care provider to find out if you are at risk for hepatitis B infection. Hepatitis C Testing is recommended for: Everyone born from [birth year]  through 1965. Anyone with known risk factors for hepatitis C. Sexually transmitted infections (STIs) Get screened for STIs, including gonorrhea and chlamydia, if: You are sexually active and are younger than 78 years of age. You are older than 78 years of age and your health care provider tells you that you are at risk for this type of infection. Your sexual activity has changed since you were last screened, and you are at increased risk for chlamydia or gonorrhea. Ask your health care provider if you are at risk. Ask your health care provider about whether you are at high risk for HIV. Your health care provider may recommend a prescription medicine to help prevent HIV infection. If you choose to take  medicine to prevent HIV, you should first get tested for HIV. You should then be tested every 3 months for as long as you are taking the medicine. Pregnancy If you are about to stop having your period (premenopausal) and you may become pregnant, seek counseling before you get pregnant. Take 400 to 800 micrograms (mcg) of folic acid every day if you become pregnant. Ask for birth control (contraception) if you want to prevent pregnancy. Osteoporosis and menopause Osteoporosis is a disease in which the bones lose minerals and strength with aging. This can result in bone fractures. If you are 45 years old or older, or if you are at risk for osteoporosis and fractures, ask your health care provider if you should: Be screened for bone loss. Take a calcium or vitamin D supplement to lower your risk of fractures. Be given hormone replacement therapy (HRT) to treat symptoms of menopause. Follow these instructions at home: Alcohol use Do not drink alcohol if: Your health care provider tells you not to drink. You are pregnant, may be pregnant, or are planning to become pregnant. If you drink alcohol: Limit how much you have to: 0-1 drink a day. Know how much alcohol is in your drink. In the [place] one drink equals one 12 oz bottle of beer (355 mL), one 5 oz glass of wine (148 mL), or one 1 oz glass of hard liquor (44 mL). Lifestyle Do not use any products that contain nicotine or tobacco. These products include cigarettes, chewing tobacco, and vaping devices, such as e-cigarettes. If you need help quitting, ask your health care provider. Do not use street drugs. Do not share needles. Ask your health care provider for help if you need support or information about quitting drugs. General instructions Schedule regular health, dental, and eye exams. Stay current with your vaccines. Tell your health care provider if: You often feel depressed. You have ever been abused or do not feel safe at  home. Summary Adopting a healthy lifestyle and getting preventive care are important in promoting health and wellness. Follow your health care provider's instructions about healthy diet, exercising, and getting tested or screened for diseases. Follow your health care provider's instructions on monitoring your cholesterol and blood pressure. This information is not intended to replace advice given to you by your health care provider. Make sure you discuss any questions you have with your health care provider. Document Revised: 07/11/2020 Document Reviewed: 07/11/2020 Elsevier Patient Education  2024 Elsevier Inc.      Edwina Barth, MD  Primary Care at [facility]

## 2022-12-31 NOTE — Assessment & Plan Note (Signed)
Advised to stay well-hydrated and avoid NSAIDs. ?

## 2022-12-31 NOTE — Assessment & Plan Note (Signed)
Chronic stable condition Rosuvastatin 10 mg daily Diet and nutrition discussed The 10-year ASCVD risk score (Arnett DK, et al., 2019) is: 41.7%   Values used to calculate the score:     Age: 77 years     Sex: Female     Is Non-Hispanic African American: Yes     Diabetic: Yes     Tobacco smoker: No     Systolic Blood Pressure: 134 mmHg     Is BP treated: Yes     HDL Cholesterol: 50.3 mg/dL     Total Cholesterol: 234 mg/dL

## 2022-12-31 NOTE — Assessment & Plan Note (Signed)
BP Readings from Last 3 Encounters:  12/31/22 134/70  03/15/22 (!) 142/88  10/19/21 128/72  Well-controlled hypertension Continue amlodipine 10 mg daily and hydrochlorothiazide 12.5 mg daily Cardiovascular risks associated with hypertension discussed Dietary approaches to stop hypertension discussed

## 2023-02-05 DIAGNOSIS — Z6831 Body mass index (BMI) 31.0-31.9, adult: Secondary | ICD-10-CM | POA: Diagnosis not present

## 2023-02-05 DIAGNOSIS — E785 Hyperlipidemia, unspecified: Secondary | ICD-10-CM | POA: Diagnosis not present

## 2023-02-05 DIAGNOSIS — Z8249 Family history of ischemic heart disease and other diseases of the circulatory system: Secondary | ICD-10-CM | POA: Diagnosis not present

## 2023-02-05 DIAGNOSIS — Z888 Allergy status to other drugs, medicaments and biological substances status: Secondary | ICD-10-CM | POA: Diagnosis not present

## 2023-02-05 DIAGNOSIS — N1831 Chronic kidney disease, stage 3a: Secondary | ICD-10-CM | POA: Diagnosis not present

## 2023-02-05 DIAGNOSIS — Z823 Family history of stroke: Secondary | ICD-10-CM | POA: Diagnosis not present

## 2023-02-05 DIAGNOSIS — I129 Hypertensive chronic kidney disease with stage 1 through stage 4 chronic kidney disease, or unspecified chronic kidney disease: Secondary | ICD-10-CM | POA: Diagnosis not present

## 2023-02-05 DIAGNOSIS — E669 Obesity, unspecified: Secondary | ICD-10-CM | POA: Diagnosis not present

## 2023-02-05 DIAGNOSIS — Z7982 Long term (current) use of aspirin: Secondary | ICD-10-CM | POA: Diagnosis not present

## 2023-02-28 ENCOUNTER — Other Ambulatory Visit: Payer: Self-pay | Admitting: Emergency Medicine

## 2023-05-14 ENCOUNTER — Other Ambulatory Visit: Payer: Self-pay | Admitting: Emergency Medicine

## 2023-05-14 DIAGNOSIS — I10 Essential (primary) hypertension: Secondary | ICD-10-CM

## 2023-05-14 DIAGNOSIS — E785 Hyperlipidemia, unspecified: Secondary | ICD-10-CM

## 2023-07-01 ENCOUNTER — Other Ambulatory Visit: Payer: Self-pay | Admitting: Emergency Medicine

## 2023-08-27 DIAGNOSIS — H35033 Hypertensive retinopathy, bilateral: Secondary | ICD-10-CM | POA: Diagnosis not present

## 2023-08-27 DIAGNOSIS — H5213 Myopia, bilateral: Secondary | ICD-10-CM | POA: Diagnosis not present

## 2023-08-27 DIAGNOSIS — H524 Presbyopia: Secondary | ICD-10-CM | POA: Diagnosis not present

## 2023-08-27 DIAGNOSIS — H52223 Regular astigmatism, bilateral: Secondary | ICD-10-CM | POA: Diagnosis not present

## 2023-09-05 ENCOUNTER — Ambulatory Visit: Payer: Medicare PPO

## 2023-09-05 VITALS — BP 140/82 | HR 71 | Ht 65.0 in | Wt 190.0 lb

## 2023-09-05 DIAGNOSIS — Z0000 Encounter for general adult medical examination without abnormal findings: Secondary | ICD-10-CM | POA: Diagnosis not present

## 2023-09-05 NOTE — Progress Notes (Signed)
 Subjective:   Amy Serrano is a 78 y.o. who presents for a Medicare Wellness preventive visit.  As a reminder, Annual Wellness Visits don't include a physical exam, and some assessments may be limited, especially if this visit is performed virtually. We may recommend an in-person follow-up visit with your provider if needed.  Visit Complete: Virtual I connected with  Barbera G Caplin on 09/05/23 by a audio enabled telemedicine application and verified that I am speaking with the correct person using two identifiers.  Patient Location: Home  Provider Location: Office/Clinic  I discussed the limitations of evaluation and management by telemedicine. The patient expressed understanding and agreed to proceed.  Vital Signs: Because this visit was a virtual/telehealth visit, some criteria may be missing or patient reported. Any vitals not documented were not able to be obtained and vitals that have been documented are patient reported.  VideoDeclined- This patient declined Librarian, academic. Therefore the visit was completed with audio only.  Persons Participating in Visit: Patient.  AWV Questionnaire: No: Patient Medicare AWV questionnaire was not completed prior to this visit.  Cardiac Risk Factors include: advanced age (>36men, >3 women);dyslipidemia;hypertension;obesity (BMI >30kg/m2)     Objective:    Today's Vitals   09/05/23 1504  BP: (!) 140/82  Pulse: 71  SpO2: 99%  Weight: 190 lb (86.2 kg)  Height: 5' 5 (1.651 m)   Body mass index is 31.62 kg/m.     09/05/2023    3:04 PM 08/30/2022    2:46 PM 09/11/2021    9:23 AM 02/15/2021    9:42 AM 02/11/2020   10:01 AM 11/26/2018   10:47 AM 01/01/2018    8:52 AM  Advanced Directives  Does Patient Have a Medical Advance Directive? Yes No No No No No Yes   Type of Estate agent of [place];Living will      Healthcare Power of Attorney  Does patient want to make changes to  medical advance directive?       No - Patient declined   Copy of Healthcare Power of Attorney in Chart? No - copy requested      No - copy requested   Would patient like information on creating a medical advance directive?  No - Patient declined No - Patient declined No - Patient declined No - Patient declined       Data saved with a previous flowsheet row definition    Current Medications (verified) Outpatient Encounter Medications as of 09/05/2023  Medication Sig   amLODipine  (NORVASC ) 10 MG tablet TAKE 1 TABLET(10 MG) BY MOUTH DAILY   aspirin 81 MG tablet Take 81 mg by mouth at bedtime.    Cholecalciferol (VITAMIN D3) 125 MCG (5000 UT) CAPS Take 1 capsule by mouth daily.   EPINEPHrine  0.3 mg/0.3 mL IJ SOAJ injection Inject 0.3 mg into the muscle as needed for anaphylaxis.   hydrochlorothiazide  (HYDRODIURIL ) 12.5 MG tablet TAKE 1 TABLET BY MOUTH EVERY DAY   Multiple Vitamin (MULTIVITAMIN) tablet Take 1 tablet by mouth daily.   Omega-3 Fatty Acids (OMEGA 3 PO) Take 1 tablet by mouth daily at 12 noon.   rosuvastatin  (CRESTOR ) 10 MG tablet TAKE 1 TABLET(10 MG) BY MOUTH DAILY   scopolamine  (TRANSDERM SCOP , 1.5 MG,) 1 MG/3DAYS Place 1 patch (1.5 mg total) onto the skin every 3 (three) days.   [DISCONTINUED] amLODipine -benazepril (LOTREL) 10-20 MG per capsule Take 1 capsule by mouth daily.   [DISCONTINUED] dicyclomine  (BENTYL ) 10 MG capsule Take 2 capsules (  20 mg total) by mouth 3 (three) times daily between meals as needed for spasms. (Patient not taking: Reported on 08/05/2015)   [DISCONTINUED] famotidine  (PEPCID ) 20 MG tablet Take 1 tablet (20 mg total) by mouth 2 (two) times daily. (Patient not taking: Reported on 08/05/2015)   [DISCONTINUED] metoCLOPramide  (REGLAN ) 10 MG tablet Take 1 tablet (10 mg total) by mouth every 6 (six) hours as needed for nausea (nausea/headache). (Patient not taking: Reported on 08/05/2015)   No facility-administered encounter medications on file as of 09/05/2023.     Allergies (verified) Benazepril, Justicia adhatoda (malabar nut tree) [justicia adhatoda], Ace inhibitors, and Peanut butter flavoring agent (non-screening)   History: Past Medical History:  Diagnosis Date   Endometriosis    Hyperlipidemia    Hypertension    Past Surgical History:  Procedure Laterality Date   arm surgery     left   Family History  Problem Relation Age of Onset   Hypertension Mother    Heart attack Father    Asthma Brother    Hypertension Other    Stroke Other    Social History   Socioeconomic History   Marital status: Widowed    Spouse name: Not on file   Number of children: Not on file   Years of education: Not on file   Highest education level: Not on file  Occupational History   Occupation: retired  Tobacco Use   Smoking status: Never   Smokeless tobacco: Never  Vaping Use   Vaping status: Never Used  Substance and Sexual Activity   Alcohol use: No   Drug use: No   Sexual activity: Not Currently  Other Topics Concern   Not on file  Social History Narrative   Not on file   Social Drivers of Health   Financial Resource Strain: Low Risk  (09/05/2023)   Overall Financial Resource Strain (CARDIA)    Difficulty of Paying Living Expenses: Not hard at all  Food Insecurity: No Food Insecurity (09/05/2023)   Hunger Vital Sign    Worried About Running Out of Food in the Last Year: Never true    Ran Out of Food in the Last Year: Never true  Transportation Needs: No Transportation Needs (09/05/2023)   PRAPARE - Administrator, Civil Service (Medical): No    Lack of Transportation (Non-Medical): No  Physical Activity: Sufficiently Active (09/05/2023)   Exercise Vital Sign    Days of Exercise per Week: 7 days    Minutes of Exercise per Session: 60 min  Stress: No Stress Concern Present (09/05/2023)   [employer] of Occupational Health - Occupational Stress Questionnaire    Feeling of Stress: Not at all  Social Connections:  Moderately Integrated (09/05/2023)   Social Connection and Isolation Panel    Frequency of Communication with Friends and Family: More than three times a week    Frequency of Social Gatherings with Friends and Family: More than three times a week    Attends Religious Services: More than 4 times per year    Active Member of [employer] or Organizations: Yes    Attends Banker Meetings: More than 4 times per year    Marital Status: Widowed    Tobacco Counseling Counseling given: No    Clinical Intake:  Pre-visit preparation completed: Yes  Pain : No/denies pain     BMI - recorded: 31.62 Nutritional Status: BMI > 30  Obese Nutritional Risks: None Diabetes: No  Lab Results  Component Value Date  HGBA1C 6.4 12/31/2022   HGBA1C 6.5 03/15/2022   HGBA1C 6.2 (H) 08/01/2021     How often do you need to have someone help you when you read instructions, pamphlets, or other written materials from your doctor or pharmacy?: 1 - Never  Interpreter Needed?: No  Information entered by :: Verdie Saba, CMA   Activities of Daily Living     09/05/2023    3:09 PM  In your present state of health, do you have any difficulty performing the following activities:  Hearing? 0  Vision? 0  Difficulty concentrating or making decisions? 0  Walking or climbing stairs? 0  Dressing or bathing? 0  Doing errands, shopping? 0  Preparing Food and eating ? N  Using the Toilet? N  In the past six months, have you accidently leaked urine? N  Do you have problems with loss of bowel control? N  Managing your Medications? N  Managing your Finances? N  Housekeeping or managing your Housekeeping? N    Patient Care Team: Purcell Emil Schanz, MD as PCP - General (Internal Medicine) Komma, Saidivya, OD (Optometry)  I have updated your Care Teams any recent Medical Services you may have received from other providers in the past year.     Assessment:   This is a routine wellness examination  for [facility].  Hearing/Vision screen Hearing Screening - Comments:: Denies hearing difficulties   Vision Screening - Comments:: Wears rx glasses - up to date with routine eye exams with  Dr Dulcie   Goals Addressed               This Visit's Progress     Patient Stated (pt-stated)        Patient stated she plans to continue to exercise        Depression Screen     09/05/2023    3:10 PM 12/31/2022    1:35 PM 08/30/2022    2:44 PM 03/15/2022    2:28 PM 10/19/2021   10:27 AM 02/15/2021    9:43 AM 02/15/2021    9:03 AM  PHQ 2/9 Scores  PHQ - 2 Score 0 0 0 0 0 0 0  PHQ- 9 Score 0          Fall Risk     09/05/2023    3:09 PM 12/31/2022    1:35 PM 08/30/2022    2:46 PM 03/15/2022    2:28 PM 10/19/2021   10:27 AM  Fall Risk   Falls in the past year? 0 0 0 0 0  Number falls in past yr: 0 0 0 0 0  Injury with Fall? 0 0 0 0 0  Risk for fall due to : No Fall Risks No Fall Risks No Fall Risks No Fall Risks No Fall Risks  Follow up Falls evaluation completed;Falls prevention discussed Falls evaluation completed Falls prevention discussed Falls evaluation completed  Falls evaluation completed      Data saved with a previous flowsheet row definition    MEDICARE RISK AT HOME:  Medicare Risk at Home Any stairs in or around the home?: No If so, are there any without handrails?: No Home free of loose throw rugs in walkways, pet beds, electrical cords, etc?: Yes Adequate lighting in your home to reduce risk of falls?: Yes Life alert?: No Use of a cane, walker or w/c?: No Grab bars in the bathroom?: No Shower chair or bench in shower?: No Elevated toilet seat or a handicapped toilet?: No  TIMED UP  AND GO:  Was the test performed?  No  Cognitive Function: 6CIT completed        09/05/2023    3:12 PM 08/30/2022    2:46 PM 02/15/2021    9:44 AM 02/11/2020   10:03 AM 11/26/2018   10:50 AM  6CIT Screen  What Year? 0 points 0 points 0 points 0 points 0 points  What month? 0 points 0  points 0 points 0 points 0 points  What time? 0 points 0 points 3 points 0 points 0 points  Count back from 20 0 points 0 points 0 points 0 points 0 points  Months in reverse 0 points 0 points 2 points 0 points 0 points  Repeat phrase 0 points 0 points 4 points 4 points 2 points  Total Score 0 points 0 points 9 points 4 points 2 points    Immunizations Immunization History  Administered Date(s) Administered   Fluad Quad(high Dose 65+) 02/11/2020   Influenza, High Dose Seasonal PF 12/02/2017, 11/26/2018   Influenza-Unspecified 12/03/2013, 11/28/2017, 01/23/2021   PFIZER(Purple Top)SARS-COV-2 Vaccination 04/20/2019, 05/12/2019, 12/07/2019, 12/16/2020    Screening Tests Health Maintenance  Topic Date Due   Pneumococcal Vaccine: 50+ Years (1 of 2 - PCV) Never done   Zoster Vaccines- Shingrix (1 of 2) Never done   COVID-19 Vaccine (5 - 2024-25 season) 11/04/2022   INFLUENZA VACCINE  10/04/2023   Medicare Annual Wellness (AWV)  09/04/2024   DEXA SCAN  Completed   Hepatitis C Screening  Completed   Hepatitis B Vaccines  Aged Out   HPV VACCINES  Aged Out   Meningococcal B Vaccine  Aged Out   DTaP/Tdap/Td  Discontinued   Colonoscopy  Discontinued    Health Maintenance  Health Maintenance Due  Topic Date Due   Pneumococcal Vaccine: 50+ Years (1 of 2 - PCV) Never done   Zoster Vaccines- Shingrix (1 of 2) Never done   COVID-19 Vaccine (5 - 2024-25 season) 11/04/2022   Health Maintenance Items Addressed:09/05/2023   Additional Screening:  Vision Screening: Recommended annual ophthalmology exams for early detection of glaucoma and other disorders of the eye. Would you like a referral to an eye doctor? No  Patient stated she's had an eye exam in 2025 w/Saidivya Komma, OD.  Dental Screening: Recommended annual dental exams for proper oral hygiene  Community Resource Referral / Chronic Care Management: CRR required this visit?  No   CCM required this visit?  No   Plan:    I  have personally reviewed and noted the following in the patient's chart:   Medical and social history Use of alcohol, tobacco or illicit drugs  Current medications and supplements including opioid prescriptions. Patient is not currently taking opioid prescriptions. Functional ability and status Nutritional status Physical activity Advanced directives List of other physicians Hospitalizations, surgeries, and ER visits in previous 12 months Vitals Screenings to include cognitive, depression, and falls Referrals and appointments  In addition, I have reviewed and discussed with patient certain preventive protocols, quality metrics, and best practice recommendations. A written personalized care plan for preventive services as well as general preventive health recommendations were provided to patient.   Verdie CHRISTELLA Saba, CMA   09/05/2023   After Visit Summary: (MyChart) Due to this being a telephonic visit, the after visit summary with patients personalized plan was offered to patient via MyChart   Notes: Nothing significant to report at this time.

## 2023-09-05 NOTE — Patient Instructions (Addendum)
 Amy Serrano , Thank you for taking time out of your busy schedule to complete your Annual Wellness Visit with me. I enjoyed our conversation and look forward to speaking with you again next year. I, as well as your care team,  appreciate your ongoing commitment to your health goals. Please review the following plan we discussed and let me know if I can assist you in the future. Your Game plan/ To Do List   Follow up Visits: Next Medicare AWV with our clinical staff: 09/10/2024   Have you seen your provider in the last 6 months (3 months if uncontrolled diabetes)? No Next Office Visit with your provider: 09/16/2023  Clinician Recommendations:  Aim for 30 minutes of exercise or brisk walking, 6-8 glasses of water, and 5 servings of fruits and vegetables each day. Educated and advised on getting the Pneumonia and Shingles vaccines in 2025.      This is a list of the screening recommended for you and due dates:  Health Maintenance  Topic Date Due   Pneumococcal Vaccine for age over 1 (1 of 2 - PCV) Never done   Zoster (Shingles) Vaccine (1 of 2) Never done   COVID-19 Vaccine (5 - 2024-25 season) 11/04/2022   Flu Shot  10/04/2023   Medicare Annual Wellness Visit  09/04/2024   DEXA scan (bone density measurement)  Completed   Hepatitis C Screening  Completed   Hepatitis B Vaccine  Aged Out   HPV Vaccine  Aged Out   Meningitis B Vaccine  Aged Out   DTaP/Tdap/Td vaccine  Discontinued   Colon Cancer Screening  Discontinued    Advanced directives: (Copy Requested) Please bring a copy of your health care power of attorney and living will to the office to be added to your chart at your convenience. You can mail to [facility] [address] [place], [place] [address] or email to [email or web address] Advance Care Planning is important because it:  [x]  Makes sure you receive the medical care that is consistent with your values, goals, and preferences  [x]  It provides  guidance to your family and loved ones and reduces their decisional burden about whether or not they are making the right decisions based on your wishes.  Follow the link provided in your after visit summary or read over the paperwork we have mailed to you to help you started getting your Advance Directives in place. If you need assistance in completing these, please reach out to us  so that we can help you!

## 2023-09-16 ENCOUNTER — Ambulatory Visit: Admitting: Emergency Medicine

## 2023-09-17 ENCOUNTER — Encounter: Payer: Self-pay | Admitting: Emergency Medicine

## 2023-09-17 ENCOUNTER — Ambulatory Visit: Payer: Self-pay | Admitting: Emergency Medicine

## 2023-09-17 ENCOUNTER — Ambulatory Visit: Admitting: Emergency Medicine

## 2023-09-17 VITALS — BP 132/74 | HR 74 | Temp 98.0°F | Ht 65.0 in | Wt 191.0 lb

## 2023-09-17 DIAGNOSIS — N1831 Chronic kidney disease, stage 3a: Secondary | ICD-10-CM

## 2023-09-17 DIAGNOSIS — E785 Hyperlipidemia, unspecified: Secondary | ICD-10-CM | POA: Diagnosis not present

## 2023-09-17 DIAGNOSIS — I10 Essential (primary) hypertension: Secondary | ICD-10-CM

## 2023-09-17 DIAGNOSIS — K922 Gastrointestinal hemorrhage, unspecified: Secondary | ICD-10-CM | POA: Diagnosis not present

## 2023-09-17 LAB — COMPREHENSIVE METABOLIC PANEL WITH GFR
ALT: 14 U/L (ref 0–35)
AST: 21 U/L (ref 0–37)
Albumin: 4.2 g/dL (ref 3.5–5.2)
Alkaline Phosphatase: 64 U/L (ref 39–117)
BUN: 19 mg/dL (ref 6–23)
CO2: 30 meq/L (ref 19–32)
Calcium: 9.6 mg/dL (ref 8.4–10.5)
Chloride: 103 meq/L (ref 96–112)
Creatinine, Ser: 0.94 mg/dL (ref 0.40–1.20)
GFR: 57.88 mL/min — ABNORMAL LOW (ref >60.00)
Glucose, Bld: 115 mg/dL — ABNORMAL HIGH (ref 70–99)
Potassium: 3.5 meq/L (ref 3.5–5.1)
Sodium: 139 meq/L (ref 135–145)
Total Bilirubin: 0.4 mg/dL (ref 0.2–1.2)
Total Protein: 7.5 g/dL (ref 6.0–8.3)

## 2023-09-17 LAB — CBC WITH DIFFERENTIAL/PLATELET
Basophils Absolute: 0 K/uL (ref 0.0–0.1)
Basophils Relative: 0.6 % (ref 0.0–3.0)
Eosinophils Absolute: 0.1 K/uL (ref 0.0–0.7)
Eosinophils Relative: 0.7 % (ref 0.0–5.0)
HCT: 37 % (ref 36.0–46.0)
Hemoglobin: 12.2 g/dL (ref 12.0–15.0)
Lymphocytes Relative: 22.9 % (ref 12.0–46.0)
Lymphs Abs: 1.7 K/uL (ref 0.7–4.0)
MCHC: 33 g/dL (ref 30.0–36.0)
MCV: 87.6 fl (ref 78.0–100.0)
Monocytes Absolute: 0.6 K/uL (ref 0.1–1.0)
Monocytes Relative: 8.6 % (ref 3.0–12.0)
Neutro Abs: 5.1 K/uL (ref 1.4–7.7)
Neutrophils Relative %: 67.2 % (ref 43.0–77.0)
Platelets: 309 K/uL (ref 150.0–400.0)
RBC: 4.22 Mil/uL (ref 3.87–5.11)
RDW: 14.4 % (ref 11.5–15.5)
WBC: 7.5 K/uL (ref 4.0–10.5)

## 2023-09-17 NOTE — Assessment & Plan Note (Signed)
 Chronic stable condition Rosuvastatin  10 mg daily Diet and nutrition discussed The 10-year ASCVD risk score (Arnett DK, et al., 2019) is: 15.5%   Values used to calculate the score:     Age: 78 years     Clincally relevant sex: Female     Is Non-Hispanic African American: Yes     Diabetic: No     Tobacco smoker: No     Systolic Blood Pressure: 132 mmHg     Is BP treated: Yes     HDL Cholesterol: 54.8 mg/dL     Total Cholesterol: 158 mg/dL

## 2023-09-17 NOTE — Assessment & Plan Note (Signed)
 Asymptomatic at present time Differential diagnosis discussed Has history of diverticulosis Clinically stable.  No red flag signs or symptoms. Recommend blood work today Needs colonoscopy.  Referral placed today.

## 2023-09-17 NOTE — Patient Instructions (Signed)
Rectal Bleeding    Rectal bleeding is when blood comes out of the opening of the butt (anus). You may see bright red blood in your underwear or in the toilet after you poop (have a bowel movement). You may also have blood mixed with your poop (stool), or dark red or black poop.  Rectal bleeding is often a sign that something is wrong. It can be caused by many things. It needs to be checked by a doctor.  Follow these instructions at home:  Medicines  Take over-the-counter and prescription medicines only as told by your doctor.  Ask your doctor about changing or stopping your normal medicines. These include blood thinners.  Managing constipation    Your condition may cause trouble pooping (constipation). To prevent or treat this, or to help make your poop soft, you may need to:  Drink enough fluid to keep your pee (urine) pale yellow.  Take over-the-counter or prescription medicines.  Eat foods that are high in fiber. These include beans, whole grains, and fresh fruits and vegetables.  Limit foods that are high in fat and sugar. These include fried or sweet foods.     General instructions  Try not to strain when you poop.  Take a warm bath. This may help with pain.  Watch for changes in your symptoms.  Contact a doctor if:  You have pain or swelling in your belly (abdomen).  You have a fever.  You feel weak or like you may vomit.  You cannot poop.  You have new or more bleeding.  You have black or dark red poop.  You vomit blood or something that looks like coffee grounds.  Get help right away if:  You faint.  You have very bad pain in your butt.  These symptoms may be an emergency. Get help right away. Call 911.  Do not wait to see if the symptoms will go away.  Do not drive yourself to the hospital.  This information is not intended to replace advice given to you by your health care provider. Make sure you discuss any questions you have with your health care provider.  Document Revised: 10/10/2021 Document Reviewed:  10/10/2021  Elsevier Patient Education  2024 [employer].

## 2023-09-17 NOTE — Assessment & Plan Note (Signed)
 BP Readings from Last 3 Encounters:  09/17/23 132/74  09/05/23 (!) 140/82  12/31/22 134/70  Well-controlled hypertension Continue amlodipine  10 mg daily and hydrochlorothiazide  12.5 mg daily Cardiovascular risk associated with hypertension discussed Diet and nutrition discussed Recommend blood work today Follow-up in 6 months

## 2023-09-17 NOTE — Progress Notes (Signed)
 Amy Serrano 78 y.o.   Chief Complaint  Patient presents with   Rectal Bleeding    Patient here for bloody stool she had 3-4 months ago, just wanting to make sure its nothing to worry about. She has not seen blood since    HISTORY OF PRESENT ILLNESS: This is a 78 y.o. female complaining of 1 episode of rectal bleeding which happened about 3 or 4 months ago without any other associated symptoms Has not happened again since. Has history of diverticulosis as per colonoscopy done about 10 years ago No other complaints or medical concerns today.  Rectal Bleeding  Pertinent negatives include no fever, no abdominal pain, no diarrhea, no nausea, no vomiting, no hematuria, no chest pain, no headaches, no coughing and no rash.     Prior to Admission medications   Medication Sig Start Date End Date Taking? Authorizing Provider  amLODipine  (NORVASC ) 10 MG tablet TAKE 1 TABLET(10 MG) BY MOUTH DAILY 07/01/23  Yes Malekai Markwood, Emil Schanz, MD  aspirin 81 MG tablet Take 81 mg by mouth at bedtime.    Yes [provider]  Cholecalciferol (VITAMIN D3) 125 MCG (5000 UT) CAPS Take 1 capsule by mouth daily.    [provider]  EPINEPHrine  0.3 mg/0.3 mL IJ SOAJ injection Inject 0.3 mg into the muscle as needed for anaphylaxis. 02/06/21   Phebe Fonda RAMAN, MD  hydrochlorothiazide  (HYDRODIURIL ) 12.5 MG tablet TAKE 1 TABLET BY MOUTH EVERY DAY 05/14/23   Purcell Emil Schanz, MD  Multiple Vitamin (MULTIVITAMIN) tablet Take 1 tablet by mouth daily.    [provider]  Omega-3 Fatty Acids (OMEGA 3 PO) Take 1 tablet by mouth daily at 12 noon.    [provider]  rosuvastatin  (CRESTOR ) 10 MG tablet TAKE 1 TABLET(10 MG) BY MOUTH DAILY 05/14/23   Purcell Emil Schanz, MD  scopolamine  (TRANSDERM SCOP , 1.5 MG,) 1 MG/3DAYS Place 1 patch (1.5 mg total) onto the skin every 3 (three) days. 10/25/22   [facility]cell Emil Schanz, MD  amLODipine -benazepril (LOTREL) 10-20 MG per capsule Take 1  capsule by mouth daily.  08/07/14  [provider]  dicyclomine  (BENTYL ) 10 MG capsule Take 2 capsules (20 mg total) by mouth 3 (three) times daily between meals as needed for spasms. Patient not taking: Reported on 08/05/2015 08/01/14 08/05/15  Josem Bring, MD  famotidine  (PEPCID ) 20 MG tablet Take 1 tablet (20 mg total) by mouth 2 (two) times daily. Patient not taking: Reported on 08/05/2015 08/07/14 08/05/15  Cleotilde Rogue, MD  metoCLOPramide  (REGLAN ) 10 MG tablet Take 1 tablet (10 mg total) by mouth every 6 (six) hours as needed for nausea (nausea/headache). Patient not taking: Reported on 08/05/2015 08/01/14 08/05/15  Josem Bring, MD    Allergies  Allergen Reactions   Benazepril Swelling    Swelling of the face    Justicia Adhatoda (Malabar Nut Tree) Landa Adhatoda] Anaphylaxis   Ace Inhibitors     Angioedema   Peanut Butter Flavoring Agent (Non-Screening)     Patient Active Problem List   Diagnosis Date Noted   Dyslipidemia 03/17/2022   Vertigo 03/15/2022   Stage 3a chronic kidney disease (HCC) 10/19/2021   Essential hypertension 10/19/2021   Hypertensive nephropathy 01/01/2018   Chronic renal disease, stage II 01/01/2018   Other abnormal glucose 01/01/2018   Class 2 drug-induced obesity with serious comorbidity and body mass index (BMI) of 35.0 to 35.9 in adult 01/01/2018    Past Medical History:  Diagnosis Date   Endometriosis  Hyperlipidemia    Hypertension     Past Surgical History:  Procedure Laterality Date   arm surgery     left    Social History   Socioeconomic History   Marital status: Widowed    Spouse name: Not on file   Number of children: Not on file   Years of education: Not on file   Highest education level: Not on file  Occupational History   Occupation: retired  Tobacco Use   Smoking status: Never   Smokeless tobacco: Never  Vaping Use   Vaping status: Never Used  Substance and Sexual Activity   Alcohol use: No   Drug use: No    Sexual activity: Not Currently  Other Topics Concern   Not on file  Social History Narrative   Not on file   Social Drivers of Health   Financial Resource Strain: Low Risk  (09/05/2023)   Overall Financial Resource Strain (CARDIA)    Difficulty of Paying Living Expenses: Not hard at all  Food Insecurity: No Food Insecurity (09/05/2023)   Hunger Vital Sign    Worried About Running Out of Food in the Last Year: Never true    Ran Out of Food in the Last Year: Never true  Transportation Needs: No Transportation Needs (09/05/2023)   PRAPARE - Administrator, Civil Service (Medical): No    Lack of Transportation (Non-Medical): No  Physical Activity: Sufficiently Active (09/05/2023)   Exercise Vital Sign    Days of Exercise per Week: 7 days    Minutes of Exercise per Session: 60 min  Stress: No Stress Concern Present (09/05/2023)   [employer] of Occupational Health - Occupational Stress Questionnaire    Feeling of Stress: Not at all  Social Connections: Moderately Integrated (09/05/2023)   Social Connection and Isolation Panel    Frequency of Communication with Friends and Family: More than three times a week    Frequency of Social Gatherings with Friends and Family: More than three times a week    Attends Religious Services: More than 4 times per year    Active Member of [employer] or Organizations: Yes    Attends Banker Meetings: More than 4 times per year    Marital Status: Widowed  Intimate Partner Violence: Not At Risk (09/05/2023)   Humiliation, Afraid, Rape, and Kick questionnaire    Fear of Current or Ex-Partner: No    Emotionally Abused: No    Physically Abused: No    Sexually Abused: No    Family History  Problem Relation Age of Onset   Hypertension Mother    Heart attack Father    Asthma Brother    Hypertension Other    Stroke Other      Review of Systems  Constitutional: Negative.  Negative for chills and fever.  HENT: Negative.  Negative for  congestion and sore throat.   Respiratory: Negative.  Negative for cough and shortness of breath.   Cardiovascular: Negative.  Negative for chest pain and palpitations.  Gastrointestinal:  Positive for blood in stool and hematochezia. Negative for abdominal pain, diarrhea, nausea and vomiting.  Genitourinary: Negative.  Negative for dysuria and hematuria.  Skin: Negative.  Negative for rash.  Neurological: Negative.  Negative for dizziness and headaches.  All other systems reviewed and are negative.   Today's Vitals   09/17/23 1346  BP: 132/74  Pulse: 74  Temp: 98 F (36.7 C)  TempSrc: Oral  SpO2: 98%  Weight: 191 lb (86.6  kg)  Height: 5' 5 (1.651 m)   Body mass index is 31.78 kg/m.   Physical Exam Vitals reviewed.  Constitutional:      Appearance: Normal appearance.  HENT:     Head: Normocephalic.     Mouth/Throat:     Mouth: Mucous membranes are moist.     Pharynx: Oropharynx is clear.  Eyes:     Extraocular Movements: Extraocular movements intact.     Conjunctiva/sclera: Conjunctivae normal.     Pupils: Pupils are equal, round, and reactive to light.  Cardiovascular:     Rate and Rhythm: Normal rate and regular rhythm.     Pulses: Normal pulses.     Heart sounds: Normal heart sounds.  Pulmonary:     Effort: Pulmonary effort is normal.     Breath sounds: Normal breath sounds.  Abdominal:     Palpations: Abdomen is soft.     Tenderness: There is no abdominal tenderness.  Skin:    General: Skin is warm and dry.  Neurological:     Mental Status: She is alert and oriented to person, place, and time.  Psychiatric:        Mood and Affect: Mood normal.        Behavior: Behavior normal.      ASSESSMENT & PLAN: A total of 42 minutes was spent with the patient and counseling/coordination of care regarding preparing for this visit, review of most recent office visit notes, review of multiple chronic medical conditions and their management, differential diagnosis of  lower GI bleed and need for colonoscopy, review of all medications, review of most recent bloodwork results, review of health maintenance items, education on nutrition, prognosis, documentation, and need for follow up.   Problem List Items Addressed This Visit       Cardiovascular and Mediastinum   Essential hypertension   BP Readings from Last 3 Encounters:  09/17/23 132/74  09/05/23 (!) 140/82  12/31/22 134/70  Well-controlled hypertension Continue amlodipine  10 mg daily and hydrochlorothiazide  12.5 mg daily Cardiovascular risk associated with hypertension discussed Diet and nutrition discussed Recommend blood work today Follow-up in 6 months       Relevant Orders   Comprehensive metabolic panel with GFR     Digestive   Lower GI bleed - Primary   Asymptomatic at present time Differential diagnosis discussed Has history of diverticulosis Clinically stable.  No red flag signs or symptoms. Recommend blood work today Needs colonoscopy.  Referral placed today.      Relevant Orders   Ambulatory referral to Gastroenterology   Comprehensive metabolic panel with GFR   CBC with Differential/Platelet     Genitourinary   Stage 3a chronic kidney disease (HCC)   Advised to stay well-hydrated and avoid NSAIDs        Relevant Orders   Comprehensive metabolic panel with GFR     Other   Dyslipidemia   Chronic stable condition Rosuvastatin  10 mg daily Diet and nutrition discussed The 10-year ASCVD risk score (Arnett DK, et al., 2019) is: 15.5%   Values used to calculate the score:     Age: 2 years     Clincally relevant sex: Female     Is Non-Hispanic African American: Yes     Diabetic: No     Tobacco smoker: No     Systolic Blood Pressure: 132 mmHg     Is BP treated: Yes     HDL Cholesterol: 54.8 mg/dL     Total Cholesterol: 158 mg/dL  Patient Instructions  Rectal Bleeding  Rectal bleeding is when blood comes out of the opening of the butt (anus). You may  see bright red blood in your underwear or in the toilet after you poop (have a bowel movement). You may also have blood mixed with your poop (stool), or dark red or black poop. Rectal bleeding is often a sign that something is wrong. It can be caused by many things. It needs to be checked by a doctor. Follow these instructions at home: Medicines Take over-the-counter and prescription medicines only as told by your doctor. Ask your doctor about changing or stopping your normal medicines. These include blood thinners. Managing constipation Your condition may cause trouble pooping (constipation). To prevent or treat this, or to help make your poop soft, you may need to: Drink enough fluid to keep your pee (urine) pale yellow. Take over-the-counter or prescription medicines. Eat foods that are high in fiber. These include beans, whole grains, and fresh fruits and vegetables. Limit foods that are high in fat and sugar. These include fried or sweet foods.  General instructions Try not to strain when you poop. Take a warm bath. This may help with pain. Watch for changes in your symptoms. Contact a doctor if: You have pain or swelling in your belly (abdomen). You have a fever. You feel weak or like you may vomit. You cannot poop. You have new or more bleeding. You have black or dark red poop. You vomit blood or something that looks like coffee grounds. Get help right away if: You faint. You have very bad pain in your butt. These symptoms may be an emergency. Get help right away. Call 911. Do not wait to see if the symptoms will go away. Do not drive yourself to the hospital. This information is not intended to replace advice given to you by your health care provider. Make sure you discuss any questions you have with your health care provider. Document Revised: 10/10/2021 Document Reviewed: 10/10/2021 Elsevier Patient Education  2024 Elsevier Inc.   Emil Schaumann, MD [facility]

## 2023-09-17 NOTE — Assessment & Plan Note (Signed)
Advised to stay well-hydrated and avoid NSAIDs. ?

## 2023-12-05 ENCOUNTER — Encounter: Payer: Self-pay | Admitting: Emergency Medicine

## 2023-12-24 ENCOUNTER — Ambulatory Visit: Admitting: Nurse Practitioner

## 2023-12-24 ENCOUNTER — Encounter: Payer: Self-pay | Admitting: Nurse Practitioner

## 2023-12-24 VITALS — BP 132/74 | HR 72 | Ht 65.0 in | Wt 190.6 lb

## 2023-12-24 DIAGNOSIS — Z860100 Personal history of colon polyps, unspecified: Secondary | ICD-10-CM

## 2023-12-24 DIAGNOSIS — K625 Hemorrhage of anus and rectum: Secondary | ICD-10-CM

## 2023-12-24 MED ORDER — NA SULFATE-K SULFATE-MG SULF 17.5-3.13-1.6 GM/177ML PO SOLN: 1.0000 | ORAL | 0 refills | Status: AC

## 2023-12-24 NOTE — Patient Instructions (Addendum)
 Apply a small amount of Desitin inside the anal opening and to the external anal area three times daily as needed for anal or hemorrhoidal irritation/bleeding.   Take Miralax 1 capful mixed in 8 ounces of water at bed time for constipation as tolerated.  Benefiber one tablespoon once daily to avoid straining   Drink 6 to 8 glasses of water daily  Contact our office if you have worsening rectal  bleeding prior to your colonoscopy date  We have sent the following medications to your pharmacy for you to pick up at your convenience: Suprep   You have been scheduled for a colonoscopy. Please follow written instructions given to you at your visit today.   If you use inhalers (even only as needed), please bring them with you on the day of your procedure.  DO NOT TAKE 7 DAYS PRIOR TO TEST- Trulicity (dulaglutide) Ozempic, Wegovy (semaglutide) Mounjaro (tirzepatide) Bydureon Bcise (exanatide extended release)  DO NOT TAKE 1 DAY PRIOR TO YOUR TEST Rybelsus (semaglutide) Adlyxin (lixisenatide) Victoza (liraglutide) Byetta (exanatide) ___________________________________________________________________________   _______________________________________________________  If your blood pressure at your visit was 140/90 or greater, please contact your primary care physician to follow up on this.  _______________________________________________________  If you are age 76 or older, your body mass index should be between 23-30. Your Body mass index is 31.72 kg/m. If this is out of the aforementioned range listed, please consider follow up with your Primary Care Provider.  If you are age 39 or younger, your body mass index should be between 19-25. Your Body mass index is 31.72 kg/m. If this is out of the aformentioned range listed, please consider follow up with your Primary Care Provider.   ________________________________________________________  The [facility] providers would like to  encourage you to use MYCHART to communicate with providers for non-urgent requests or questions.  Due to long hold times on the telephone, sending your provider a message by [facility] may be a faster and more efficient way to get a response.  Please allow 48 business hours for a response.  Please remember that this is for non-urgent requests.  _______________________________________________________  [facility] is using a team-based approach to care.  Your team is made up of your doctor and two to three APPS. Our APPS (Nurse Practitioners and Physician Assistants) work with your physician to ensure care continuity for you. They are fully qualified to address your health concerns and develop a treatment plan. They communicate directly with your gastroenterologist to care for you. Seeing the Advanced Practice Practitioners on your physician's team can help you by facilitating care more promptly, often allowing for earlier appointments, access to diagnostic testing, procedures, and other specialty referrals.   Thank you for choosing me and [facility].  [employer] CRNP

## 2023-12-24 NOTE — Progress Notes (Signed)
 12/24/2023 Amy Serrano [id number] [birth year]   CHIEF COMPLAINT: Rectal bleeding, discuss scheduling a colonoscopy  HISTORY OF PRESENT ILLNESS: Amy Serrano is a 78 year old female with a past medical history of hyperlipidemia, hypertension, endometriosis s/p total hysterectomy 1993. She presents to our office today as referred by Dr. Emil Schanz Sagardia to schedule a colonoscopy.  She endorsed passing a bowel movement with associated straining and saw a small amount of bright red blood on the toilet tissue 3 or 4 months ago and 2 days ago she passed a BM and saw a scant amount of pinkish blood on the toilet tissue. She contacted Dr. Sagardia who recommended for her to schedule a colonoscopy.  She typically passes a normal formed brown bowel movement once daily but every once if her dietary routine is off track or if she does not drink enough water she may strain to pass a BM.  She endorsed undergoing a colonoscopy in 2002 and believes 1 tiny colon polyp was removed.  No further colonoscopies since then.  No known family history of colon polyps or colorectal cancer.  No abdominal pain.  Appetite is good.  She remains quite active.  No history of cardiac or pulmonary disease.  She takes aspirin 81 mg once daily, no other NSAID use.      Latest Ref Rng & Units 09/17/2023    2:05 PM 12/31/2022    2:28 PM 03/15/2022    2:55 PM  CBC  WBC 4.0 - 10.5 K/uL 7.5  7.4  7.7   Hemoglobin 12.0 - 15.0 g/dL 87.7  87.2  86.7   Hematocrit 36.0 - 46.0 % 37.0  39.8  40.6   Platelets 150.0 - 400.0 K/uL 309.0  320.0  377.0         Latest Ref Rng & Units 09/17/2023    2:05 PM 12/31/2022    2:28 PM 03/15/2022    2:55 PM  CMP  Glucose 70 - 99 mg/dL 884  898  899   BUN 6 - 23 mg/dL 19  19  21    Creatinine 0.40 - 1.20 mg/dL 9.05  9.04  8.95   Sodium 135 - 145 mEq/L 139  139  139   Potassium 3.5 - 5.1 mEq/L 3.5  4.6  4.0   Chloride 96 - 112 mEq/L 103  102  101   CO2 19 - 32 mEq/L 30  29  25     Calcium  8.4 - 10.5 mg/dL 9.6  9.8  9.4   Total Protein 6.0 - 8.3 g/dL 7.5  7.8  7.7   Total Bilirubin 0.2 - 1.2 mg/dL 0.4  0.3  0.3   Alkaline Phos 39 - 117 U/L 64  70  73   AST 0 - 37 U/L 21  19  20    ALT 0 - 35 U/L 14  15  14       Past Medical History:  Diagnosis Date   Endometriosis    Hyperlipidemia    Hypertension    Past Surgical History:  Procedure Laterality Date   arm surgery     left  Total hysterectomy 1993  Social History: She is widowed. Nonsmoker. Rare wine intake. No drug use.  One of her sisters lives nearby.  Family History: Father MI. Mother CVA. No known family history of esophageal, gastric, colon cancer, liver or pancreatic cancer.   Allergies  Allergen Reactions   Benazepril Swelling    Swelling of the face  Justicia Adhatoda (Malabar Nut Tree) [Justicia Adhatoda] Anaphylaxis   Ace Inhibitors     Angioedema   Peanut Butter Flavoring Agent (Non-Screening)      Outpatient Encounter Medications as of 12/24/2023  Medication Sig   amLODipine  (NORVASC ) 10 MG tablet TAKE 1 TABLET(10 MG) BY MOUTH DAILY   aspirin 81 MG tablet Take 81 mg by mouth at bedtime.    Cholecalciferol (VITAMIN D3) 125 MCG (5000 UT) CAPS Take 1 capsule by mouth daily.   EPINEPHrine  0.3 mg/0.3 mL IJ SOAJ injection Inject 0.3 mg into the muscle as needed for anaphylaxis.   hydrochlorothiazide  (HYDRODIURIL ) 12.5 MG tablet TAKE 1 TABLET BY MOUTH EVERY DAY   Multiple Vitamin (MULTIVITAMIN) tablet Take 1 tablet by mouth daily.   Omega-3 Fatty Acids (OMEGA 3 PO) Take 1 tablet by mouth daily at 12 noon.   rosuvastatin  (CRESTOR ) 10 MG tablet TAKE 1 TABLET(10 MG) BY MOUTH DAILY   [DISCONTINUED] amLODipine -benazepril (LOTREL) 10-20 MG per capsule Take 1 capsule by mouth daily.   [DISCONTINUED] dicyclomine  (BENTYL ) 10 MG capsule Take 2 capsules (20 mg total) by mouth 3 (three) times daily between meals as needed for spasms. (Patient not taking: Reported on 08/05/2015)   [DISCONTINUED]  famotidine  (PEPCID ) 20 MG tablet Take 1 tablet (20 mg total) by mouth 2 (two) times daily. (Patient not taking: Reported on 08/05/2015)   [DISCONTINUED] metoCLOPramide  (REGLAN ) 10 MG tablet Take 1 tablet (10 mg total) by mouth every 6 (six) hours as needed for nausea (nausea/headache). (Patient not taking: Reported on 08/05/2015)   No facility-administered encounter medications on file as of 12/24/2023.    REVIEW OF SYSTEMS:  Gen: Denies fever, sweats or chills. No weight loss.  CV: Denies chest pain, palpitations or edema. Resp: Denies cough, shortness of breath of hemoptysis.  GI: See HPI. No GERD symptoms.  GU: Denies urinary burning, blood in urine, increased urinary frequency or incontinence. MS: Denies joint pain, muscles aches or weakness. Derm: Denies rash, itchiness, skin lesions or unhealing ulcers. Psych: Denies depression, anxiety, memory loss or confusion. Heme: Denies bruising, easy bleeding. Neuro:  Denies headaches, dizziness or paresthesias. Endo:  Denies any problems with DM, thyroid or adrenal function.  PHYSICAL EXAM: BP 132/74   Pulse 72   Ht 5' 5 (1.651 m)   Wt 190 lb 9.6 oz (86.5 kg)   BMI 31.72 kg/m  Wt Readings from Last 3 Encounters:  12/24/23 190 lb 9.6 oz (86.5 kg)  09/17/23 191 lb (86.6 kg)  09/05/23 190 lb (86.2 kg)    General: 78 year old female in no acute distress. Head: Normocephalic and atraumatic. Eyes:  Sclerae non-icteric, conjunctive pink. Ears: Normal auditory acuity. Mouth: Dentition intact. No ulcers or lesions.  Neck: Supple, no lymphadenopathy or thyromegaly.  Lungs: Clear bilaterally to auscultation without wheezes, crackles or rhonchi. Heart: Regular rate and rhythm. No murmur, rub or gallop appreciated.  Abdomen: Soft, nontender, nondistended. No masses. No hepatosplenomegaly. Normoactive bowel sounds x 4 quadrants.  Rectal: No external hemorrhoids.  Small internal hemorrhoids palpated without prolapse.  Brown stool in the rectal  vault.  No mass within reach.  June CMA present during exam. Musculoskeletal: Symmetrical with no gross deformities. Skin: Warm and dry. No rash or lesions on visible extremities. Extremities: No edema. Neurological: Alert oriented x 4, no focal deficits.  Psychological: Alert and cooperative. Normal mood and affect.  ASSESSMENT AND PLAN:  78 year old female with rectal bleeding x 2 episodes which occurred 2 months ago and 2 days ago triggered by straining when  passing a BM.  Patient endorses undergoing a colonoscopy in 2002 and believes 1 tiny polyp was removed.  On ASA 81 mg daily. - Colonoscopy benefits and risks discussed including risk with sedation, risk of bleeding, perforation and infection  - Benefiber 1 tablespoon daily - Parallax nightly as needed - Patient to contact our office if rectal bleeding worsens prior to colonoscopy date - Recommend repeating CBC if she has recurrent rectal bleeding  Constipation, infrequent - Patient instructed to drink 6 to 8 glasses of water daily - See plan above  CKD stage IIIA  DM type II   CC:  Sagardia, Gilman, *

## 2023-12-24 NOTE — Progress Notes (Signed)
 Agree with assessment and plan as outlined.

## 2024-02-18 ENCOUNTER — Encounter: Admitting: Gastroenterology

## 2024-09-10 ENCOUNTER — Ambulatory Visit
